# Patient Record
Sex: Female | Born: 1950 | Race: Black or African American | Hispanic: No | Marital: Married | Smoking: Current every day smoker
Health system: Southern US, Community
[De-identification: ages and names within clinical notes are randomized; demographics above are authoritative.]

## PROBLEM LIST (undated history)

## (undated) DIAGNOSIS — D219 Benign neoplasm of connective and other soft tissue, unspecified: Secondary | ICD-10-CM

## (undated) DIAGNOSIS — M797 Fibromyalgia: Secondary | ICD-10-CM

## (undated) DIAGNOSIS — G4730 Sleep apnea, unspecified: Secondary | ICD-10-CM

## (undated) DIAGNOSIS — F32A Depression, unspecified: Secondary | ICD-10-CM

## (undated) DIAGNOSIS — E119 Type 2 diabetes mellitus without complications: Secondary | ICD-10-CM

## (undated) DIAGNOSIS — D649 Anemia, unspecified: Secondary | ICD-10-CM

## (undated) DIAGNOSIS — H269 Unspecified cataract: Secondary | ICD-10-CM

## (undated) DIAGNOSIS — E739 Lactose intolerance, unspecified: Secondary | ICD-10-CM

## (undated) DIAGNOSIS — I10 Essential (primary) hypertension: Secondary | ICD-10-CM

## (undated) DIAGNOSIS — M1990 Unspecified osteoarthritis, unspecified site: Secondary | ICD-10-CM

## (undated) DIAGNOSIS — N920 Excessive and frequent menstruation with regular cycle: Secondary | ICD-10-CM

## (undated) DIAGNOSIS — F329 Major depressive disorder, single episode, unspecified: Secondary | ICD-10-CM

## (undated) DIAGNOSIS — K529 Noninfective gastroenteritis and colitis, unspecified: Secondary | ICD-10-CM

## (undated) DIAGNOSIS — E785 Hyperlipidemia, unspecified: Secondary | ICD-10-CM

## (undated) DIAGNOSIS — J439 Emphysema, unspecified: Secondary | ICD-10-CM

## (undated) HISTORY — DX: Major depressive disorder, single episode, unspecified: F32.9

## (undated) HISTORY — DX: Unspecified cataract: H26.9

## (undated) HISTORY — PX: OTHER SURGICAL HISTORY: SHX169

## (undated) HISTORY — DX: Lactose intolerance, unspecified: E73.9

## (undated) HISTORY — DX: Depression, unspecified: F32.A

## (undated) HISTORY — DX: Essential (primary) hypertension: I10

## (undated) HISTORY — PX: POLYPECTOMY: SHX149

## (undated) HISTORY — PX: LAPAROSCOPIC SALPINGO OOPHERECTOMY: SHX5927

## (undated) HISTORY — DX: Unspecified osteoarthritis, unspecified site: M19.90

## (undated) HISTORY — PX: CATARACT EXTRACTION, BILATERAL: SHX1313

## (undated) HISTORY — DX: Type 2 diabetes mellitus without complications: E11.9

## (undated) HISTORY — DX: Sleep apnea, unspecified: G47.30

## (undated) HISTORY — PX: ABDOMINAL HYSTERECTOMY: SHX81

## (undated) HISTORY — DX: Emphysema, unspecified: J43.9

## (undated) HISTORY — DX: Noninfective gastroenteritis and colitis, unspecified: K52.9

## (undated) HISTORY — DX: Fibromyalgia: M79.7

## (undated) HISTORY — DX: Anemia, unspecified: D64.9

## (undated) HISTORY — PX: TUBAL LIGATION: SHX77

## (undated) HISTORY — PX: BREAST BIOPSY: SHX20

## (undated) HISTORY — PX: ROTATOR CUFF REPAIR: SHX139

## (undated) HISTORY — DX: Benign neoplasm of connective and other soft tissue, unspecified: D21.9

## (undated) HISTORY — DX: Hyperlipidemia, unspecified: E78.5

## (undated) HISTORY — DX: Excessive and frequent menstruation with regular cycle: N92.0

---

## 1998-02-01 ENCOUNTER — Emergency Department (HOSPITAL_COMMUNITY): Admission: EM | Admit: 1998-02-01 | Discharge: 1998-02-01 | Payer: Self-pay | Admitting: Emergency Medicine

## 1998-11-11 ENCOUNTER — Other Ambulatory Visit: Admission: RE | Admit: 1998-11-11 | Discharge: 1998-11-11 | Payer: Self-pay | Admitting: Obstetrics and Gynecology

## 1998-11-16 ENCOUNTER — Ambulatory Visit (HOSPITAL_COMMUNITY): Admission: RE | Admit: 1998-11-16 | Discharge: 1998-11-16 | Payer: Self-pay | Admitting: Gastroenterology

## 2000-02-06 ENCOUNTER — Encounter: Payer: Self-pay | Admitting: Internal Medicine

## 2000-02-06 ENCOUNTER — Encounter: Admission: RE | Admit: 2000-02-06 | Discharge: 2000-02-06 | Payer: Self-pay | Admitting: Internal Medicine

## 2000-02-08 ENCOUNTER — Other Ambulatory Visit: Admission: RE | Admit: 2000-02-08 | Discharge: 2000-02-08 | Payer: Self-pay | Admitting: Obstetrics and Gynecology

## 2001-02-07 ENCOUNTER — Encounter: Payer: Self-pay | Admitting: Internal Medicine

## 2001-02-07 ENCOUNTER — Encounter: Admission: RE | Admit: 2001-02-07 | Discharge: 2001-02-07 | Payer: Self-pay | Admitting: Internal Medicine

## 2002-07-09 ENCOUNTER — Encounter: Payer: Self-pay | Admitting: Specialist

## 2002-07-09 ENCOUNTER — Ambulatory Visit (HOSPITAL_COMMUNITY): Admission: RE | Admit: 2002-07-09 | Discharge: 2002-07-09 | Payer: Self-pay | Admitting: Specialist

## 2002-07-31 ENCOUNTER — Encounter: Admission: RE | Admit: 2002-07-31 | Discharge: 2002-07-31 | Payer: Self-pay | Admitting: Obstetrics and Gynecology

## 2002-07-31 ENCOUNTER — Encounter: Payer: Self-pay | Admitting: Obstetrics and Gynecology

## 2002-08-05 ENCOUNTER — Emergency Department (HOSPITAL_COMMUNITY): Admission: EM | Admit: 2002-08-05 | Discharge: 2002-08-05 | Payer: Self-pay | Admitting: *Deleted

## 2002-09-11 ENCOUNTER — Other Ambulatory Visit: Admission: RE | Admit: 2002-09-11 | Discharge: 2002-09-11 | Payer: Self-pay | Admitting: Obstetrics and Gynecology

## 2003-06-30 ENCOUNTER — Ambulatory Visit (HOSPITAL_COMMUNITY): Admission: RE | Admit: 2003-06-30 | Discharge: 2003-06-30 | Payer: Self-pay | Admitting: Internal Medicine

## 2003-11-25 ENCOUNTER — Encounter: Admission: RE | Admit: 2003-11-25 | Discharge: 2003-11-25 | Payer: Self-pay | Admitting: Obstetrics and Gynecology

## 2003-11-26 ENCOUNTER — Encounter: Admission: RE | Admit: 2003-11-26 | Discharge: 2003-11-26 | Payer: Self-pay | Admitting: Obstetrics and Gynecology

## 2004-09-20 ENCOUNTER — Other Ambulatory Visit: Admission: RE | Admit: 2004-09-20 | Discharge: 2004-09-20 | Payer: Self-pay | Admitting: Obstetrics and Gynecology

## 2004-10-13 ENCOUNTER — Ambulatory Visit (HOSPITAL_COMMUNITY): Admission: RE | Admit: 2004-10-13 | Discharge: 2004-10-13 | Payer: Self-pay | Admitting: Gastroenterology

## 2004-10-24 ENCOUNTER — Ambulatory Visit (HOSPITAL_COMMUNITY): Admission: RE | Admit: 2004-10-24 | Discharge: 2004-10-24 | Payer: Self-pay | Admitting: Orthopedic Surgery

## 2004-10-24 ENCOUNTER — Ambulatory Visit (HOSPITAL_BASED_OUTPATIENT_CLINIC_OR_DEPARTMENT_OTHER): Admission: RE | Admit: 2004-10-24 | Discharge: 2004-10-24 | Payer: Self-pay | Admitting: Orthopedic Surgery

## 2006-06-23 ENCOUNTER — Emergency Department (HOSPITAL_COMMUNITY): Admission: EM | Admit: 2006-06-23 | Discharge: 2006-06-23 | Payer: Self-pay | Admitting: Emergency Medicine

## 2006-10-16 ENCOUNTER — Ambulatory Visit (HOSPITAL_COMMUNITY): Admission: RE | Admit: 2006-10-16 | Discharge: 2006-10-16 | Payer: Self-pay | Admitting: Internal Medicine

## 2006-11-13 ENCOUNTER — Ambulatory Visit (HOSPITAL_COMMUNITY): Admission: RE | Admit: 2006-11-13 | Discharge: 2006-11-13 | Payer: Self-pay | Admitting: Internal Medicine

## 2007-03-15 ENCOUNTER — Encounter: Admission: RE | Admit: 2007-03-15 | Discharge: 2007-03-15 | Payer: Self-pay | Admitting: Obstetrics and Gynecology

## 2007-09-04 ENCOUNTER — Ambulatory Visit (HOSPITAL_COMMUNITY): Admission: RE | Admit: 2007-09-04 | Discharge: 2007-09-04 | Payer: Self-pay | Admitting: Internal Medicine

## 2007-10-13 ENCOUNTER — Emergency Department (HOSPITAL_COMMUNITY): Admission: EM | Admit: 2007-10-13 | Discharge: 2007-10-13 | Payer: Self-pay | Admitting: Family Medicine

## 2008-03-17 ENCOUNTER — Encounter: Admission: RE | Admit: 2008-03-17 | Discharge: 2008-03-17 | Payer: Self-pay | Admitting: Obstetrics and Gynecology

## 2008-05-08 ENCOUNTER — Emergency Department (HOSPITAL_COMMUNITY): Admission: EM | Admit: 2008-05-08 | Discharge: 2008-05-08 | Payer: Self-pay | Admitting: Emergency Medicine

## 2009-04-21 ENCOUNTER — Encounter: Admission: RE | Admit: 2009-04-21 | Discharge: 2009-04-21 | Payer: Self-pay | Admitting: Obstetrics and Gynecology

## 2009-10-27 ENCOUNTER — Encounter: Admission: RE | Admit: 2009-10-27 | Discharge: 2009-10-27 | Payer: Self-pay | Admitting: Obstetrics and Gynecology

## 2009-11-10 ENCOUNTER — Encounter: Admission: RE | Admit: 2009-11-10 | Discharge: 2009-11-10 | Payer: Self-pay | Admitting: Obstetrics and Gynecology

## 2010-04-04 ENCOUNTER — Emergency Department (HOSPITAL_COMMUNITY): Admission: EM | Admit: 2010-04-04 | Discharge: 2010-04-04 | Payer: Self-pay | Admitting: Family Medicine

## 2010-06-07 ENCOUNTER — Emergency Department (HOSPITAL_COMMUNITY)
Admission: EM | Admit: 2010-06-07 | Discharge: 2010-06-07 | Payer: Self-pay | Source: Home / Self Care | Admitting: Family Medicine

## 2010-07-10 ENCOUNTER — Encounter: Payer: Self-pay | Admitting: Obstetrics and Gynecology

## 2010-11-04 NOTE — Op Note (Signed)
Sara Powell, Sara Powell             ACCOUNT NO.:  [id number]   MEDICAL RECORD NO.:  [id number]          PATIENT TYPE:  AMB   LOCATION:  DSC                          FACILITY:  [facility]   PHYSICIAN:  Mila Homer. Sherlean Foot, M.D. DATE OF BIRTH:  1950/10/28   DATE OF PROCEDURE:  10/24/2004  DATE OF DISCHARGE:                                 OPERATIVE REPORT   SURGEON:  Mila Homer. Sherlean Foot, M.D.   ASSISTANT:  Legrand Pitts. Duffy, P.A.   ANESTHESIA:  General.   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   PROCEDURE:  Right shoulder arthroscopy, subacromial decompression,  glenohumeral debridement, distal clavicle resection, and mini-open rotator  cuff repair.   INDICATIONS FOR PROCEDURE:  The patient is a 60 year old with MRI evidence  of full thickness rotator cuff tear.  Informed consent was obtained.   DESCRIPTION OF PROCEDURE:  The patient was laid supine and administered  general anesthesia.  The right upper extremity was prepped and draped in the  usual sterile fashion with the patient in the beach chair position.  After  sterile prep and drape, I made an anterior, posterior, and direct lateral  portal with a number 11 blade blunt trocar and cannula.  Diagnostic  arthroscopy of the glenohumeral joint revealed minimal chondromalacia but a  full thickness undersurface tear of the supraspinatus anterior as well as a  labral tear.  This was a degenerative labral tear and went from 3 to 9  o'clock.  I used the 3.5 Gator shaver and debrided the labrum back to stable  rim of tissue and debrided the undersurface of the rotator cuff.  I then  went from the posterior portal into the subacromial space and from the  direct lateral portal performed a bursectomy and anterolateral acromioplasty  with a  4.9 mm bur.  I then used the ArthroCare debridement wand to release  the CA ligament and debrided the undersurface of the clavicle to expose  that.  This was very prominent as was the anterolateral acromion.  I used a  4.9 mm bur to perform a distal clavicle resection.  This gave a nice  decompression to the rotator cuff and the rotator cuff tear was  approximately 2 cm long and not retracted.  I used the bur to bur a nice  trough and almost did this arthroscopically but decided to it mini open so I  evacuated it from the scope and then made our lateral incision another  centimeter long.  I then split the deltoid along the lines of its raphe and  put the Arthrex shoulder retractor in place.  I could easily see the tear,  evaluate it, and I put in two 5.5 mm BioCorkscew anchors.  I placed modified  Mason-Allen sutures, repairing the rotator cuff.  This gave an excellent  repair.  I then closed it with interrupted 0 and 2-0 Vicryl sutures and  Steri-Strips.   COMPLICATIONS:  None.   DRAINS:  None.  DRESSINGS:  Adaptic 4 x 4, 2 inch silk tape, simple sling.      SDL/MEDQ  D:  10/24/2004  T:  10/24/2004  Job:  [id number]

## 2010-11-04 NOTE — Op Note (Signed)
NAMERASHIA, MCKESSON             ACCOUNT NO.:  [id number]   MEDICAL RECORD NO.:  [id number]          PATIENT TYPE:  AMB   LOCATION:  ENDO                         FACILITY:  [facility]   PHYSICIAN:  John C. Madilyn Fireman, M.D.    DATE OF BIRTH:  03-14-1951   DATE OF PROCEDURE:  10/13/2004  DATE OF DISCHARGE:                                 OPERATIVE REPORT   PROCEDURE:  Colonoscopy.   INDICATIONS FOR PROCEDURE:  Family history of colon cancer in a first and  second-degree relative, last colonoscopy 5 years ago.   PROCEDURE:  The patient was placed in the left lateral decubitus position  and placed on pulse monitor with continuous low-flow oxygen delivered by  nasal cannula.  She was sedated with 62.5 mcg IV fentanyl and 7 mg IV  Versed.  The Olympus video colonoscope was inserted into the rectum and  advanced to the cecum, confirmed by transillumination of McBurney's point  and visualization at the ileocecal valve and appendiceal orifice.  The prep  was good.  The cecum, ascending, transverse, descending, and sigmoid colon  all appeared normal with no masses, polyps, diverticula, or any other  mucosal abnormalities.  The rectum likewise appeared normal, and retroflexed  view of the anus revealed no obvious internal hemorrhoids.  The scope was  then withdrawn and the patient returned to the recovery room in stable  condition.  She tolerated procedure well, and there were no immediate  complications.   IMPRESSION:  Normal colonoscopy.   PLAN:  Repeat study in 5 years.      JCH/MEDQ  D:  10/13/2004  T:  10/13/2004  Job:  [id number]   cc:   Margaretmary Bayley, M.D.  [address]  [place]  [place] [address]  Fax: [phone]

## 2010-12-27 ENCOUNTER — Inpatient Hospital Stay (INDEPENDENT_AMBULATORY_CARE_PROVIDER_SITE_OTHER)
Admission: RE | Admit: 2010-12-27 | Discharge: 2010-12-27 | Disposition: A | Discharge status: Home / Self Care | Payer: Self-pay | Source: Ambulatory Visit | Attending: Family Medicine | Admitting: Family Medicine

## 2010-12-27 DIAGNOSIS — I10 Essential (primary) hypertension: Secondary | ICD-10-CM

## 2011-01-31 ENCOUNTER — Inpatient Hospital Stay (INDEPENDENT_AMBULATORY_CARE_PROVIDER_SITE_OTHER)
Admission: RE | Admit: 2011-01-31 | Discharge: 2011-01-31 | Disposition: A | Discharge status: Home / Self Care | Payer: Self-pay | Source: Ambulatory Visit | Attending: Family Medicine | Admitting: Family Medicine

## 2011-01-31 DIAGNOSIS — L02619 Cutaneous abscess of unspecified foot: Secondary | ICD-10-CM

## 2011-01-31 DIAGNOSIS — L03039 Cellulitis of unspecified toe: Secondary | ICD-10-CM

## 2011-02-02 LAB — CULTURE, ROUTINE-ABSCESS

## 2011-02-14 ENCOUNTER — Inpatient Hospital Stay (INDEPENDENT_AMBULATORY_CARE_PROVIDER_SITE_OTHER)
Admission: RE | Admit: 2011-02-14 | Discharge: 2011-02-14 | Disposition: A | Discharge status: Home / Self Care | Payer: Self-pay | Source: Ambulatory Visit | Attending: Family Medicine | Admitting: Family Medicine

## 2011-02-14 DIAGNOSIS — L988 Other specified disorders of the skin and subcutaneous tissue: Secondary | ICD-10-CM

## 2011-03-21 LAB — POCT RAPID STREP A: Streptococcus, Group A Screen (Direct): NEGATIVE

## 2011-04-10 ENCOUNTER — Inpatient Hospital Stay (INDEPENDENT_AMBULATORY_CARE_PROVIDER_SITE_OTHER)
Admission: RE | Admit: 2011-04-10 | Discharge: 2011-04-10 | Disposition: A | Discharge status: Home / Self Care | Payer: Self-pay | Source: Ambulatory Visit | Attending: Emergency Medicine | Admitting: Emergency Medicine

## 2011-04-10 DIAGNOSIS — R51 Headache: Secondary | ICD-10-CM

## 2011-06-19 ENCOUNTER — Other Ambulatory Visit: Payer: Self-pay | Admitting: *Deleted

## 2012-02-27 ENCOUNTER — Other Ambulatory Visit (HOSPITAL_COMMUNITY): Payer: Self-pay | Admitting: Internal Medicine

## 2012-02-27 DIAGNOSIS — Z1231 Encounter for screening mammogram for malignant neoplasm of breast: Secondary | ICD-10-CM

## 2012-03-11 ENCOUNTER — Ambulatory Visit (HOSPITAL_COMMUNITY)
Admission: RE | Admit: 2012-03-11 | Discharge: 2012-03-11 | Disposition: A | Discharge status: Home / Self Care | Payer: Medicare Other | Source: Ambulatory Visit | Attending: Internal Medicine | Admitting: Internal Medicine

## 2012-03-11 DIAGNOSIS — Z1231 Encounter for screening mammogram for malignant neoplasm of breast: Secondary | ICD-10-CM | POA: Insufficient documentation

## 2012-08-15 ENCOUNTER — Ambulatory Visit: Payer: Medicare Other | Admitting: Obstetrics and Gynecology

## 2012-08-15 ENCOUNTER — Encounter: Payer: Self-pay | Admitting: Obstetrics and Gynecology

## 2012-08-15 VITALS — BP 154/76 | Ht 66.0 in | Wt 187.0 lb

## 2012-08-15 NOTE — Progress Notes (Signed)
Subjective:    Sara Powell is a 62 y.o. female [phone] who presents for annual exam. The patient complaints of fibromyalgia. She has had a hysterectomy and BSO.  She is married but is not sexually active.  The following portions of the patient's history were reviewed and updated as appropriate: allergies, current medications, past family history, past medical history, past social history, past surgical history and problem list.  Review of Systems Pertinent items are noted in HPI. Gastrointestinal:No change in bowel habits, no abdominal pain, no rectal bleeding. She does have IBS. Genitourinary:negative for dysuria, frequency, hematuria, nocturia and urinary incontinence    Objective:     BP 154/76  Ht 5\' 6"  (1.676 m)  Wt 187 lb (84.823 kg)  BMI 30.2 kg/m2  Weight:  Wt Readings from Last 1 Encounters:  08/15/12 187 lb (84.823 kg)     BMI: Body mass index is 30.2 kg/(m^2). General Appearance: Alert, appropriate appearance for age. No acute distress HEENT: Grossly normal Neck / Thyroid: Supple, no masses, nodes or enlargement Lungs: clear to auscultation bilaterally Back: No CVA tenderness Breast Exam: No masses or nodes.No dimpling, nipple retraction or discharge. Cardiovascular: Regular rate and rhythm. S1, S2, no murmur Gastrointestinal: Soft, non-tender, no masses or organomegaly  ++++++++++++++++++++++++++++++++++++++++++++++++++++++++  Pelvic Exam: External genitalia: normal general appearance Vaginal: normal without tenderness, induration or masses and atrophic mucosa Cervix: absent Adnexa: normal bimanual exam Uterus: absent Rectovaginal: normal rectal, no masses  ++++++++++++++++++++++++++++++++++++++++++++++++++++++++  Lymphatic Exam: Non-palpable nodes in neck, clavicular, axillary, or inguinal regions  Psychiatric: Alert and oriented, appropriate affect.      Assessment:    Normal gyn exam   Overweight or obese: Yes  Pelvic relaxation:  No  Menopausal symptoms: No. Severe: No.  Fibromyalgia  Irritable bowel syndrome   Plan:    Mammogram.   Follow-up:  for annual exam  The updated Pap smear screening guidelines were discussed with the patient. The patient requested that I obtain a Pap smear: No.  Kegel exercises discussed: Yes.  Proper diet and regular exercise were reviewed.  Annual mammograms recommended starting at age 49. Proper breast care was discussed.  Screening colonoscopy is recommended beginning at age 52.  Regular health maintenance was reviewed.  Sleep hygiene was discussed.  Adequate calcium and vitamin D intake was emphasized.  Leonard Schwartz M.D.   Regular Periods: Hysterectomy NO:[id number]}Mammogram: yes  Monthly Breast Ex.: yes Exercise: no  Tetanus < 10 years: yes Seatbelts: yes  NI. Bladder Functn.: yes Abuse at home: no  Daily BM's: yes Stressful Work: no  Healthy Diet: yes Sigmoid-Colonoscopy: per pt 2005  Calcium: no Medical problems this year: no   LAST PAP:03/11/2008  Contraception: Hysterectomy  Mammogram:  03/12/2012 Normal  PCP: Dr Shary Decamp  PMH: no  FMH: no  Last Bone Scan: none

## 2012-11-29 ENCOUNTER — Encounter: Payer: Self-pay | Admitting: Gastroenterology

## 2012-12-24 ENCOUNTER — Encounter: Payer: Self-pay | Admitting: Gastroenterology

## 2012-12-24 ENCOUNTER — Ambulatory Visit (AMBULATORY_SURGERY_CENTER): Payer: Medicare Other

## 2012-12-24 VITALS — Ht 66.0 in | Wt 185.2 lb

## 2012-12-24 DIAGNOSIS — Z800 Family history of malignant neoplasm of digestive organs: Secondary | ICD-10-CM

## 2012-12-24 MED ORDER — MOVIPREP 100 G PO SOLR: ORAL | Status: DC

## 2012-12-24 NOTE — Progress Notes (Signed)
Pt came into the office today for her pre-visit prior to her colonoscopy with Dr Jarold Motto on 12/30/12.Pt states she had a colonoscopy done with Dr Dorena Cookey years ago, but does not remember the date. She signed a medical release form to get all her GI records, which will be sent to [facility].

## 2012-12-30 ENCOUNTER — Ambulatory Visit (AMBULATORY_SURGERY_CENTER): Payer: Medicare Other | Admitting: Gastroenterology

## 2012-12-30 ENCOUNTER — Encounter: Payer: Self-pay | Admitting: Gastroenterology

## 2012-12-30 VITALS — BP 139/79 | HR 59 | Temp 98.1°F | Resp 14 | Ht 66.0 in | Wt 185.0 lb

## 2012-12-30 DIAGNOSIS — D126 Benign neoplasm of colon, unspecified: Secondary | ICD-10-CM

## 2012-12-30 DIAGNOSIS — Z1211 Encounter for screening for malignant neoplasm of colon: Secondary | ICD-10-CM

## 2012-12-30 DIAGNOSIS — Z800 Family history of malignant neoplasm of digestive organs: Secondary | ICD-10-CM

## 2012-12-30 MED ORDER — SODIUM CHLORIDE 0.9 % IV SOLN: 500.0000 mL | INTRAVENOUS | Status: DC

## 2012-12-30 NOTE — Progress Notes (Signed)
Called to room to assist during endoscopic procedure.  Patient ID and intended procedure confirmed with present staff. Received instructions for my participation in the procedure from the performing physician.  

## 2012-12-30 NOTE — Patient Instructions (Addendum)
Impressions/recommendations:  Polyps (handout given)  Repeat colonoscopy in 3 years.  YOU HAD AN ENDOSCOPIC PROCEDURE TODAY AT THE [facility]: Refer to the procedure report that was given to you for any specific questions about what was found during the examination.  If the procedure report does not answer your questions, please call your gastroenterologist to clarify.  If you requested that your care partner not be given the details of your procedure findings, then the procedure report has been included in a sealed envelope for you to review at your convenience later.  YOU SHOULD EXPECT: Some feelings of bloating in the abdomen. Passage of more gas than usual.  Walking can help get rid of the air that was put into your GI tract during the procedure and reduce the bloating. If you had a lower endoscopy (such as a colonoscopy or flexible sigmoidoscopy) you may notice spotting of blood in your stool or on the toilet paper. If you underwent a bowel prep for your procedure, then you may not have a normal bowel movement for a few days.  DIET: Your first meal following the procedure should be a light meal and then it is ok to progress to your normal diet.  A half-sandwich or bowl of soup is an example of a good first meal.  Heavy or fried foods are harder to digest and may make you feel nauseous or bloated.  Likewise meals heavy in dairy and vegetables can cause extra gas to form and this can also increase the bloating.  Drink plenty of fluids but you should avoid alcoholic beverages for 24 hours.  ACTIVITY: Your care partner should take you home directly after the procedure.  You should plan to take it easy, moving slowly for the rest of the day.  You can resume normal activity the day after the procedure however you should NOT DRIVE or use heavy machinery for 24 hours (because of the sedation medicines used during the test).    SYMPTOMS TO REPORT IMMEDIATELY: A gastroenterologist can be  reached at any hour.  During normal business hours, 8:30 AM to 5:00 PM Monday through Friday, call [phone].  After hours and on weekends, please call the GI answering service at [phone] who will take a message and have the physician on call contact you.   Following lower endoscopy (colonoscopy or flexible sigmoidoscopy):  Excessive amounts of blood in the stool  Significant tenderness or worsening of abdominal pains  Swelling of the abdomen that is new, acute  Fever of 100F or higher   FOLLOW UP: If any biopsies were taken you will be contacted by phone or by letter within the next 1-3 weeks.  Call your gastroenterologist if you have not heard about the biopsies in 3 weeks.  Our staff will call the home number listed on your records the next business day following your procedure to check on you and address any questions or concerns that you may have at that time regarding the information given to you following your procedure. This is a courtesy call and so if there is no answer at the home number and we have not heard from you through the emergency physician on call, we will assume that you have returned to your regular daily activities without incident.  SIGNATURES/CONFIDENTIALITY: You and/or your care partner have signed paperwork which will be entered into your electronic medical record.  These signatures attest to the fact that that the information above on your After Visit Summary has  been reviewed and is understood.  Full responsibility of the confidentiality of this discharge information lies with you and/or your care-partner.

## 2012-12-30 NOTE — Progress Notes (Signed)
Patient did not experience any of the following events: a burn prior to discharge; a fall within the facility; wrong site/side/patient/procedure/implant event; or a hospital transfer or hospital admission upon discharge from the facility. (G8907) Patient did not have preoperative order for IV antibiotic SSI prophylaxis. (G8918)  

## 2012-12-30 NOTE — Op Note (Signed)
[facility] [address] [place], [address]   COLONOSCOPY PROCEDURE REPORT  PATIENT: Sara, Powell  MR#: [id number] BIRTHDATE: 1951-06-05 , 62  yrs. old GENDER: Female ENDOSCOPIST: Mardella Layman, MD, [facility] REFERRED BY: PROCEDURE DATE:  12/30/2012 PROCEDURE:   Colonoscopy with snare polypectomy ASA CLASS:   Class II INDICATIONS:Patient's immediate family history of colon cancer. MEDICATIONS: propofol (Diprivan) 200mg  IV  DESCRIPTION OF PROCEDURE:   After the risks and benefits and of the procedure were explained, informed consent was obtained.  A digital rectal exam revealed no abnormalities of the rectum.    The LB [id number]  endoscope was introduced through the anus and advanced to the cecum, which was identified by both the appendix and ileocecal valve .  The quality of the prep was good, using MoviPrep .  The instrument was then slowly withdrawn as the colon was fully examined.     COLON FINDINGS: Three smooth flat polyps ranging between 3-38mm in size were found in the sigmoid colon.  A polypectomy was performed using snare cautery.  The resection was complete and the polyp tissue was partially retrieved.   The colon was otherwise normal. There was no diverticulosis, inflammation, polyps or cancers unless previously stated. Very redundant colon noted and difficult exam. Retroflexed views revealed no abnormalities.     The scope was then withdrawn from the patient and the procedure completed.  COMPLICATIONS: There were no complications. ENDOSCOPIC IMPRESSION: 1.   Three flat polyps ranging between 3-38mm in size were found in the sigmoid colon; polypectomy was performed using snare cautery 2.   The colon was otherwise normal ..FH colon cancer  RECOMMENDATIONS: 1.  Await pathology results 2.  Repeat Colonoscopy in 3 years.   REPEAT EXAM:  cc:  _______________________________ eSignedMardella Layman, MD, [facility] 12/30/2012 11:03  AM     PATIENT NAME:  Sara, Powell MR#: [id number]

## 2012-12-31 ENCOUNTER — Encounter: Payer: Self-pay | Admitting: Gastroenterology

## 2012-12-31 ENCOUNTER — Telehealth: Payer: Self-pay | Admitting: *Deleted

## 2012-12-31 NOTE — Telephone Encounter (Signed)
  Follow up Call-  Call back number 12/30/2012  Post procedure Call Back phone  # [phone]  Permission to leave phone message Yes     Patient questions:  Do you have a fever, pain , or abdominal swelling? no Pain Score  0 *  Have you tolerated food without any problems? yes  Have you been able to return to your normal activities? yes  Do you have any questions about your discharge instructions: Diet   no Medications  no Follow up visit  no  Do you have questions or concerns about your Care? no  Actions: * If pain score is 4 or above: No action needed, pain <4.

## 2013-01-07 ENCOUNTER — Encounter: Payer: Medicare Other | Admitting: Gastroenterology

## 2013-01-09 ENCOUNTER — Encounter: Payer: Self-pay | Admitting: Gastroenterology

## 2013-02-03 ENCOUNTER — Other Ambulatory Visit: Payer: Self-pay | Admitting: Obstetrics and Gynecology

## 2013-02-03 DIAGNOSIS — Z1231 Encounter for screening mammogram for malignant neoplasm of breast: Secondary | ICD-10-CM

## 2013-03-12 ENCOUNTER — Ambulatory Visit (HOSPITAL_COMMUNITY)
Admission: RE | Admit: 2013-03-12 | Discharge: 2013-03-12 | Disposition: A | Discharge status: Home / Self Care | Payer: Medicare Other | Source: Ambulatory Visit | Attending: Obstetrics and Gynecology | Admitting: Obstetrics and Gynecology

## 2013-03-12 DIAGNOSIS — Z1231 Encounter for screening mammogram for malignant neoplasm of breast: Secondary | ICD-10-CM

## 2013-12-22 ENCOUNTER — Other Ambulatory Visit: Payer: Self-pay | Admitting: Obstetrics and Gynecology

## 2013-12-22 DIAGNOSIS — N644 Mastodynia: Secondary | ICD-10-CM

## 2013-12-31 ENCOUNTER — Ambulatory Visit
Admission: RE | Admit: 2013-12-31 | Discharge: 2013-12-31 | Disposition: A | Discharge status: Home / Self Care | Payer: Medicare Other | Source: Ambulatory Visit | Attending: Obstetrics and Gynecology | Admitting: Obstetrics and Gynecology

## 2013-12-31 ENCOUNTER — Encounter (INDEPENDENT_AMBULATORY_CARE_PROVIDER_SITE_OTHER): Payer: Self-pay

## 2013-12-31 DIAGNOSIS — N644 Mastodynia: Secondary | ICD-10-CM

## 2014-02-13 ENCOUNTER — Other Ambulatory Visit (HOSPITAL_COMMUNITY): Payer: Self-pay | Admitting: Obstetrics and Gynecology

## 2014-03-12 ENCOUNTER — Other Ambulatory Visit: Payer: Self-pay

## 2014-03-12 DIAGNOSIS — Z1231 Encounter for screening mammogram for malignant neoplasm of breast: Secondary | ICD-10-CM

## 2014-04-01 ENCOUNTER — Ambulatory Visit
Admission: RE | Admit: 2014-04-01 | Discharge: 2014-04-01 | Disposition: A | Discharge status: Home / Self Care | Payer: Medicare Other | Source: Ambulatory Visit

## 2014-04-01 DIAGNOSIS — Z1231 Encounter for screening mammogram for malignant neoplasm of breast: Secondary | ICD-10-CM

## 2014-04-06 ENCOUNTER — Other Ambulatory Visit: Payer: Self-pay | Admitting: Obstetrics and Gynecology

## 2014-04-06 DIAGNOSIS — N6452 Nipple discharge: Secondary | ICD-10-CM

## 2014-04-16 ENCOUNTER — Ambulatory Visit
Admission: RE | Admit: 2014-04-16 | Discharge: 2014-04-16 | Disposition: A | Discharge status: Home / Self Care | Payer: Medicare Other | Source: Ambulatory Visit | Attending: Obstetrics and Gynecology | Admitting: Obstetrics and Gynecology

## 2014-04-16 DIAGNOSIS — N6452 Nipple discharge: Secondary | ICD-10-CM

## 2014-04-20 ENCOUNTER — Encounter: Payer: Self-pay | Admitting: Gastroenterology

## 2014-06-22 DIAGNOSIS — I129 Hypertensive chronic kidney disease with stage 1 through stage 4 chronic kidney disease, or unspecified chronic kidney disease: Secondary | ICD-10-CM | POA: Diagnosis not present

## 2014-06-22 DIAGNOSIS — Z0000 Encounter for general adult medical examination without abnormal findings: Secondary | ICD-10-CM | POA: Diagnosis not present

## 2014-06-22 DIAGNOSIS — E1121 Type 2 diabetes mellitus with diabetic nephropathy: Secondary | ICD-10-CM | POA: Diagnosis not present

## 2014-06-22 DIAGNOSIS — Z1389 Encounter for screening for other disorder: Secondary | ICD-10-CM | POA: Diagnosis not present

## 2014-06-22 DIAGNOSIS — D81819 Biotin-dependent carboxylase deficiency, unspecified: Secondary | ICD-10-CM | POA: Diagnosis not present

## 2014-06-22 DIAGNOSIS — Z23 Encounter for immunization: Secondary | ICD-10-CM | POA: Diagnosis not present

## 2014-06-29 DIAGNOSIS — I10 Essential (primary) hypertension: Secondary | ICD-10-CM | POA: Diagnosis not present

## 2014-06-29 DIAGNOSIS — E1121 Type 2 diabetes mellitus with diabetic nephropathy: Secondary | ICD-10-CM | POA: Diagnosis not present

## 2014-06-29 DIAGNOSIS — N182 Chronic kidney disease, stage 2 (mild): Secondary | ICD-10-CM | POA: Diagnosis not present

## 2014-06-29 DIAGNOSIS — F17210 Nicotine dependence, cigarettes, uncomplicated: Secondary | ICD-10-CM | POA: Diagnosis not present

## 2014-06-29 DIAGNOSIS — F419 Anxiety disorder, unspecified: Secondary | ICD-10-CM | POA: Diagnosis not present

## 2014-09-18 DIAGNOSIS — M797 Fibromyalgia: Secondary | ICD-10-CM | POA: Diagnosis not present

## 2014-09-18 DIAGNOSIS — N200 Calculus of kidney: Secondary | ICD-10-CM | POA: Diagnosis not present

## 2014-09-18 DIAGNOSIS — M1611 Unilateral primary osteoarthritis, right hip: Secondary | ICD-10-CM | POA: Diagnosis not present

## 2014-09-28 DIAGNOSIS — M545 Low back pain: Secondary | ICD-10-CM | POA: Diagnosis not present

## 2014-09-28 DIAGNOSIS — E1121 Type 2 diabetes mellitus with diabetic nephropathy: Secondary | ICD-10-CM | POA: Diagnosis not present

## 2014-09-28 DIAGNOSIS — R262 Difficulty in walking, not elsewhere classified: Secondary | ICD-10-CM | POA: Diagnosis not present

## 2014-09-28 DIAGNOSIS — I10 Essential (primary) hypertension: Secondary | ICD-10-CM | POA: Diagnosis not present

## 2014-09-28 DIAGNOSIS — M797 Fibromyalgia: Secondary | ICD-10-CM | POA: Diagnosis not present

## 2014-09-28 DIAGNOSIS — M25569 Pain in unspecified knee: Secondary | ICD-10-CM | POA: Diagnosis not present

## 2014-09-29 DIAGNOSIS — M25569 Pain in unspecified knee: Secondary | ICD-10-CM | POA: Diagnosis not present

## 2014-09-29 DIAGNOSIS — M545 Low back pain: Secondary | ICD-10-CM | POA: Diagnosis not present

## 2014-09-29 DIAGNOSIS — R262 Difficulty in walking, not elsewhere classified: Secondary | ICD-10-CM | POA: Diagnosis not present

## 2014-09-29 DIAGNOSIS — M797 Fibromyalgia: Secondary | ICD-10-CM | POA: Diagnosis not present

## 2014-09-30 DIAGNOSIS — M545 Low back pain: Secondary | ICD-10-CM | POA: Diagnosis not present

## 2014-09-30 DIAGNOSIS — M25569 Pain in unspecified knee: Secondary | ICD-10-CM | POA: Diagnosis not present

## 2014-09-30 DIAGNOSIS — R262 Difficulty in walking, not elsewhere classified: Secondary | ICD-10-CM | POA: Diagnosis not present

## 2014-09-30 DIAGNOSIS — M797 Fibromyalgia: Secondary | ICD-10-CM | POA: Diagnosis not present

## 2014-10-05 DIAGNOSIS — F17210 Nicotine dependence, cigarettes, uncomplicated: Secondary | ICD-10-CM | POA: Diagnosis not present

## 2014-10-05 DIAGNOSIS — F419 Anxiety disorder, unspecified: Secondary | ICD-10-CM | POA: Diagnosis not present

## 2014-10-05 DIAGNOSIS — N182 Chronic kidney disease, stage 2 (mild): Secondary | ICD-10-CM | POA: Diagnosis not present

## 2014-10-05 DIAGNOSIS — I129 Hypertensive chronic kidney disease with stage 1 through stage 4 chronic kidney disease, or unspecified chronic kidney disease: Secondary | ICD-10-CM | POA: Diagnosis not present

## 2014-10-05 DIAGNOSIS — E1122 Type 2 diabetes mellitus with diabetic chronic kidney disease: Secondary | ICD-10-CM | POA: Diagnosis not present

## 2014-10-07 DIAGNOSIS — M25569 Pain in unspecified knee: Secondary | ICD-10-CM | POA: Diagnosis not present

## 2014-10-07 DIAGNOSIS — M797 Fibromyalgia: Secondary | ICD-10-CM | POA: Diagnosis not present

## 2014-10-07 DIAGNOSIS — R262 Difficulty in walking, not elsewhere classified: Secondary | ICD-10-CM | POA: Diagnosis not present

## 2014-10-07 DIAGNOSIS — M545 Low back pain: Secondary | ICD-10-CM | POA: Diagnosis not present

## 2014-12-01 DIAGNOSIS — M5416 Radiculopathy, lumbar region: Secondary | ICD-10-CM | POA: Diagnosis not present

## 2014-12-01 DIAGNOSIS — M1611 Unilateral primary osteoarthritis, right hip: Secondary | ICD-10-CM | POA: Diagnosis not present

## 2014-12-01 DIAGNOSIS — M797 Fibromyalgia: Secondary | ICD-10-CM | POA: Diagnosis not present

## 2014-12-28 DIAGNOSIS — I129 Hypertensive chronic kidney disease with stage 1 through stage 4 chronic kidney disease, or unspecified chronic kidney disease: Secondary | ICD-10-CM | POA: Diagnosis not present

## 2014-12-28 DIAGNOSIS — E1122 Type 2 diabetes mellitus with diabetic chronic kidney disease: Secondary | ICD-10-CM | POA: Diagnosis not present

## 2014-12-28 DIAGNOSIS — E538 Deficiency of other specified B group vitamins: Secondary | ICD-10-CM | POA: Diagnosis not present

## 2014-12-31 DIAGNOSIS — M25551 Pain in right hip: Secondary | ICD-10-CM | POA: Diagnosis not present

## 2014-12-31 DIAGNOSIS — M1611 Unilateral primary osteoarthritis, right hip: Secondary | ICD-10-CM | POA: Diagnosis not present

## 2015-01-04 DIAGNOSIS — F3340 Major depressive disorder, recurrent, in remission, unspecified: Secondary | ICD-10-CM | POA: Diagnosis not present

## 2015-01-04 DIAGNOSIS — I129 Hypertensive chronic kidney disease with stage 1 through stage 4 chronic kidney disease, or unspecified chronic kidney disease: Secondary | ICD-10-CM | POA: Diagnosis not present

## 2015-01-04 DIAGNOSIS — F17210 Nicotine dependence, cigarettes, uncomplicated: Secondary | ICD-10-CM | POA: Diagnosis not present

## 2015-01-04 DIAGNOSIS — E1122 Type 2 diabetes mellitus with diabetic chronic kidney disease: Secondary | ICD-10-CM | POA: Diagnosis not present

## 2015-01-04 DIAGNOSIS — N182 Chronic kidney disease, stage 2 (mild): Secondary | ICD-10-CM | POA: Diagnosis not present

## 2015-01-04 DIAGNOSIS — Z23 Encounter for immunization: Secondary | ICD-10-CM | POA: Diagnosis not present

## 2015-02-02 DIAGNOSIS — M1611 Unilateral primary osteoarthritis, right hip: Secondary | ICD-10-CM | POA: Diagnosis not present

## 2015-02-19 DIAGNOSIS — M1611 Unilateral primary osteoarthritis, right hip: Secondary | ICD-10-CM | POA: Diagnosis not present

## 2015-03-04 DIAGNOSIS — M1611 Unilateral primary osteoarthritis, right hip: Secondary | ICD-10-CM | POA: Diagnosis not present

## 2015-03-10 DIAGNOSIS — M797 Fibromyalgia: Secondary | ICD-10-CM | POA: Diagnosis not present

## 2015-03-10 DIAGNOSIS — M5416 Radiculopathy, lumbar region: Secondary | ICD-10-CM | POA: Diagnosis not present

## 2015-03-10 DIAGNOSIS — Z6829 Body mass index (BMI) 29.0-29.9, adult: Secondary | ICD-10-CM | POA: Diagnosis not present

## 2015-03-10 DIAGNOSIS — M1611 Unilateral primary osteoarthritis, right hip: Secondary | ICD-10-CM | POA: Diagnosis not present

## 2015-03-10 DIAGNOSIS — Z01419 Encounter for gynecological examination (general) (routine) without abnormal findings: Secondary | ICD-10-CM | POA: Diagnosis not present

## 2015-03-10 DIAGNOSIS — M7552 Bursitis of left shoulder: Secondary | ICD-10-CM | POA: Diagnosis not present

## 2015-03-31 DIAGNOSIS — M545 Low back pain: Secondary | ICD-10-CM | POA: Diagnosis not present

## 2015-03-31 DIAGNOSIS — M47816 Spondylosis without myelopathy or radiculopathy, lumbar region: Secondary | ICD-10-CM | POA: Diagnosis not present

## 2015-04-15 DIAGNOSIS — R51 Headache: Secondary | ICD-10-CM | POA: Diagnosis not present

## 2015-04-15 DIAGNOSIS — M542 Cervicalgia: Secondary | ICD-10-CM | POA: Diagnosis not present

## 2015-04-15 DIAGNOSIS — G441 Vascular headache, not elsewhere classified: Secondary | ICD-10-CM | POA: Diagnosis not present

## 2015-04-15 DIAGNOSIS — M545 Low back pain: Secondary | ICD-10-CM | POA: Diagnosis not present

## 2015-04-15 DIAGNOSIS — M546 Pain in thoracic spine: Secondary | ICD-10-CM | POA: Diagnosis not present

## 2015-04-19 DIAGNOSIS — M545 Low back pain: Secondary | ICD-10-CM | POA: Diagnosis not present

## 2015-04-19 DIAGNOSIS — G441 Vascular headache, not elsewhere classified: Secondary | ICD-10-CM | POA: Diagnosis not present

## 2015-04-19 DIAGNOSIS — M542 Cervicalgia: Secondary | ICD-10-CM | POA: Diagnosis not present

## 2015-04-19 DIAGNOSIS — R51 Headache: Secondary | ICD-10-CM | POA: Diagnosis not present

## 2015-04-19 DIAGNOSIS — M546 Pain in thoracic spine: Secondary | ICD-10-CM | POA: Diagnosis not present

## 2015-04-28 DIAGNOSIS — G441 Vascular headache, not elsewhere classified: Secondary | ICD-10-CM | POA: Diagnosis not present

## 2015-04-28 DIAGNOSIS — M546 Pain in thoracic spine: Secondary | ICD-10-CM | POA: Diagnosis not present

## 2015-04-28 DIAGNOSIS — M542 Cervicalgia: Secondary | ICD-10-CM | POA: Diagnosis not present

## 2015-04-28 DIAGNOSIS — R51 Headache: Secondary | ICD-10-CM | POA: Diagnosis not present

## 2015-04-28 DIAGNOSIS — M545 Low back pain: Secondary | ICD-10-CM | POA: Diagnosis not present

## 2015-04-29 ENCOUNTER — Other Ambulatory Visit: Payer: Self-pay | Admitting: Internal Medicine

## 2015-04-29 ENCOUNTER — Other Ambulatory Visit: Payer: Self-pay | Admitting: Chiropractic Medicine

## 2015-04-29 DIAGNOSIS — M5416 Radiculopathy, lumbar region: Secondary | ICD-10-CM

## 2015-04-29 DIAGNOSIS — Z1231 Encounter for screening mammogram for malignant neoplasm of breast: Secondary | ICD-10-CM | POA: Diagnosis not present

## 2015-04-29 DIAGNOSIS — M5126 Other intervertebral disc displacement, lumbar region: Secondary | ICD-10-CM

## 2015-05-08 ENCOUNTER — Inpatient Hospital Stay
Admission: RE | Admit: 2015-05-08 | Discharge: 2015-05-08 | Disposition: A | Discharge status: Home / Self Care | Payer: Self-pay | Source: Ambulatory Visit | Attending: Chiropractic Medicine | Admitting: Chiropractic Medicine

## 2015-05-17 DIAGNOSIS — H04123 Dry eye syndrome of bilateral lacrimal glands: Secondary | ICD-10-CM | POA: Diagnosis not present

## 2015-05-17 DIAGNOSIS — H40033 Anatomical narrow angle, bilateral: Secondary | ICD-10-CM | POA: Diagnosis not present

## 2015-07-07 DIAGNOSIS — E538 Deficiency of other specified B group vitamins: Secondary | ICD-10-CM | POA: Diagnosis not present

## 2015-07-07 DIAGNOSIS — Z0000 Encounter for general adult medical examination without abnormal findings: Secondary | ICD-10-CM | POA: Diagnosis not present

## 2015-07-07 DIAGNOSIS — N390 Urinary tract infection, site not specified: Secondary | ICD-10-CM | POA: Diagnosis not present

## 2015-07-07 DIAGNOSIS — Z1389 Encounter for screening for other disorder: Secondary | ICD-10-CM | POA: Diagnosis not present

## 2015-07-07 DIAGNOSIS — E1122 Type 2 diabetes mellitus with diabetic chronic kidney disease: Secondary | ICD-10-CM | POA: Diagnosis not present

## 2015-07-07 DIAGNOSIS — I129 Hypertensive chronic kidney disease with stage 1 through stage 4 chronic kidney disease, or unspecified chronic kidney disease: Secondary | ICD-10-CM | POA: Diagnosis not present

## 2015-07-07 DIAGNOSIS — Z23 Encounter for immunization: Secondary | ICD-10-CM | POA: Diagnosis not present

## 2015-07-07 DIAGNOSIS — N182 Chronic kidney disease, stage 2 (mild): Secondary | ICD-10-CM | POA: Diagnosis not present

## 2015-07-07 DIAGNOSIS — Z13820 Encounter for screening for osteoporosis: Secondary | ICD-10-CM | POA: Diagnosis not present

## 2015-07-12 DIAGNOSIS — M5416 Radiculopathy, lumbar region: Secondary | ICD-10-CM | POA: Diagnosis not present

## 2015-07-12 DIAGNOSIS — M7552 Bursitis of left shoulder: Secondary | ICD-10-CM | POA: Diagnosis not present

## 2015-07-12 DIAGNOSIS — M797 Fibromyalgia: Secondary | ICD-10-CM | POA: Diagnosis not present

## 2015-07-12 DIAGNOSIS — M1611 Unilateral primary osteoarthritis, right hip: Secondary | ICD-10-CM | POA: Diagnosis not present

## 2015-07-27 ENCOUNTER — Ambulatory Visit (INDEPENDENT_AMBULATORY_CARE_PROVIDER_SITE_OTHER): Payer: Medicare Other | Admitting: Physician Assistant

## 2015-07-27 VITALS — BP 162/84 | HR 68 | Temp 98.9°F | Resp 18 | Ht 66.0 in | Wt 185.8 lb

## 2015-07-27 DIAGNOSIS — L989 Disorder of the skin and subcutaneous tissue, unspecified: Secondary | ICD-10-CM

## 2015-07-27 DIAGNOSIS — R030 Elevated blood-pressure reading, without diagnosis of hypertension: Secondary | ICD-10-CM | POA: Diagnosis not present

## 2015-07-27 DIAGNOSIS — S50821A Blister (nonthermal) of right forearm, initial encounter: Secondary | ICD-10-CM | POA: Diagnosis not present

## 2015-07-27 DIAGNOSIS — IMO0001 Reserved for inherently not codable concepts without codable children: Secondary | ICD-10-CM

## 2015-07-27 MED ORDER — MUPIROCIN 2 % EX OINT: 1.0000 "application " | TOPICAL_OINTMENT | Freq: Two times a day (BID) | CUTANEOUS | Status: DC

## 2015-07-27 MED ORDER — VALACYCLOVIR HCL 1 G PO TABS: 1000.0000 mg | ORAL_TABLET | Freq: Two times a day (BID) | ORAL | Status: DC

## 2015-07-27 NOTE — Patient Instructions (Signed)
Apply mupirocin ointment twice a day until scabbed over Take valtrex twice a day for 7 days.  Take your blood pressure regularly and keep a record to take to your primary in March.  Come back in 1 week for skin biopsy.

## 2015-07-27 NOTE — Progress Notes (Signed)
[facility] [address] [place] [address] [phone]  Date:  07/27/2015   Name:  Sara Powell   DOB:  Jan 21, 1951   MRN:  [id number]  PCP:  Thressa Sheller, MD    Chief Complaint: Arm Problem   History of Present Illness:  This is a 65 y.o. female with PMH HTN, depression who is presenting with a blister on her right forearm x 2 days. States started off with 2 small blisters which eventually coalesced into one big blister. There is some mild pain. No drainage. No other blisters/lesions. No burn or injury. No hx herpes or shingles.  Pt also complaining of a skin lesion on her right scalp. No itching or pain. Been there for 1 week. Red and raised.  Pt with elevated BP today to 162/84. She takes lisinopril 20 and hctz 25. She states her bp is not usually elevated. She took meds today. She has a bp monitor at home but doesn't check routinely. PCP: Thressa Sheller. Has appt for f/u in March.   Review of Systems:  Review of Systems See HPI  There are no active problems to display for this patient.   Prior to Admission medications   Medication Sig Start Date End Date Taking? Authorizing Provider  aspirin 81 MG tablet Take 81 mg by mouth daily.   Yes Historical Provider, MD  B Complex-Biotin-FA (B-COMPLEX PO) Take by mouth daily.   Yes Historical Provider, MD  buPROPion (WELLBUTRIN XL) 150 MG 24 hr tablet  12/06/12  Yes Historical Provider, MD  buPROPion (ZYBAN) 150 MG 12 hr tablet Take 150 mg by mouth 2 (two) times daily.   Yes Historical Provider, MD  hydrochlorothiazide (HYDRODIURIL) 25 MG tablet Take 25 mg by mouth daily.   Yes Historical Provider, MD  ibuprofen (ADVIL,MOTRIN) 600 MG tablet  12/10/12  Yes Historical Provider, MD  lisinopril (PRINIVIL,ZESTRIL) 20 MG tablet Take 20 mg by mouth daily.   Yes Historical Provider, MD  Multiple Vitamin (MULTIVITAMIN) tablet Take 1 tablet by mouth daily.   Yes Historical Provider, MD  tiZANidine (ZANAFLEX) 4 MG  capsule Take 4 mg by mouth 3 (three) times daily.   Yes Historical Provider, MD  traZODone (DESYREL) 50 MG tablet Take 50 mg by mouth at bedtime. Take one half   Yes Historical Provider, MD  traMADol (ULTRAM) 50 MG tablet Take 50 mg by mouth every 6 (six) hours as needed for pain. Reported on 07/27/2015    Historical Provider, MD    Allergies  Allergen Reactions  . Penicillins Hives and Itching  . Tylox [Oxycodone-Acetaminophen] Hives and Itching    Past Surgical History  Procedure Laterality Date  . Abdominal hysterectomy    . Laparoscopic salpingo oopherectomy    . Tubal ligation    . Rotator cuff repair      right shoulder  . Sty  on eyelid      left eye/removed 2 times  . Colonoscopy      Social History  Substance Use Topics  . Smoking status: Current Every Day Smoker -- 0.33 packs/day    Types: Cigarettes  . Smokeless tobacco: Never Used  . Alcohol Use: No     Comment: occasional beer    Family History  Problem Relation Age of Onset  . Diabetes Mother   . Hypertension Mother   . Hypertension Father   . Heart disease Maternal Aunt   . Heart attack Maternal Aunt   . Cancer Maternal Aunt  stomach  . Diabetes Paternal Aunt   . Diabetes Maternal Grandmother   . Stroke Maternal Grandmother   . Cancer Maternal Grandfather     colon  . Colon cancer Maternal Grandfather   . Diabetes Paternal Grandmother   . Colon cancer Sister   . Diabetes Brother   . Colon polyps Sister     Medication list has been reviewed and updated.  Physical Examination:  Physical Exam  Constitutional: She is oriented to person, place, and time. She appears well-developed and well-nourished. No distress.  HENT:  Head: Normocephalic and atraumatic.  Right Ear: Hearing normal.  Left Ear: Hearing normal.  Nose: Nose normal.  Eyes: Conjunctivae and lids are normal. Right eye exhibits no discharge. Left eye exhibits no discharge. No scleral icterus.  Cardiovascular: Normal rate, regular  rhythm, normal heart sounds and normal pulses.   No murmur heard. Pulmonary/Chest: Effort normal and breath sounds normal. No respiratory distress. She has no wheezes. She has no rhonchi. She has no rales.  Musculoskeletal: Normal range of motion.  Neurological: She is alert and oriented to person, place, and time.  Skin: Skin is warm, dry and intact.  1 cm blister on right volar forearm.  1 cm raised erythematous dry lesion on right side of scalp  Psychiatric: She has a normal mood and affect. Her speech is normal and behavior is normal. Thought content normal.   BP 162/84 mmHg  Pulse 68  Temp(Src) 98.9 F (37.2 C) (Oral)  Resp 18  Ht 5\' 6"  (1.676 m)  Wt 185 lb 12.8 oz (84.278 kg)  BMI 30.00 kg/m2  SpO2 95%  Assessment and Plan:  1. Blister of forearm, right, initial encounter Blister unroofed and obtained would culture and HSV culture. Treat with valtrex and mupirocin.  - mupirocin ointment (BACTROBAN) 2 %; Apply 1 application topically 2 (two) times daily.  Dispense: 22 g; Refill: 0 - valACYclovir (VALTREX) 1000 MG tablet; Take 1 tablet (1,000 mg total) by mouth 2 (two) times daily.  Dispense: 14 tablet; Refill: 0 - Wound culture - Herpes simplex virus culture  2. Skin lesion of scalp Pt will return in 3 weeks for biopsy if lesion is still there. May cancel if lesion resolves.  3. Elevated BP Pt states BP not usually elevated. She will continue medication and take BP at home and keep record to take to PCP - has appt in 1 month.   Benjaman Pott Drenda Freeze, MHS [facility]  08/03/2015

## 2015-07-29 LAB — HERPES SIMPLEX VIRUS CULTURE: Organism ID, Bacteria: NOT DETECTED

## 2015-07-30 LAB — WOUND CULTURE
GRAM STAIN: NONE SEEN
Gram Stain: NONE SEEN
Organism ID, Bacteria: NO GROWTH

## 2015-08-23 DIAGNOSIS — N182 Chronic kidney disease, stage 2 (mild): Secondary | ICD-10-CM | POA: Diagnosis not present

## 2015-08-23 DIAGNOSIS — F17210 Nicotine dependence, cigarettes, uncomplicated: Secondary | ICD-10-CM | POA: Diagnosis not present

## 2015-08-23 DIAGNOSIS — M8580 Other specified disorders of bone density and structure, unspecified site: Secondary | ICD-10-CM | POA: Diagnosis not present

## 2015-08-23 DIAGNOSIS — E1122 Type 2 diabetes mellitus with diabetic chronic kidney disease: Secondary | ICD-10-CM | POA: Diagnosis not present

## 2015-08-23 DIAGNOSIS — I129 Hypertensive chronic kidney disease with stage 1 through stage 4 chronic kidney disease, or unspecified chronic kidney disease: Secondary | ICD-10-CM | POA: Diagnosis not present

## 2015-08-24 ENCOUNTER — Ambulatory Visit: Payer: Medicare Other | Admitting: Physician Assistant

## 2015-08-30 DIAGNOSIS — L723 Sebaceous cyst: Secondary | ICD-10-CM | POA: Diagnosis not present

## 2015-08-30 DIAGNOSIS — L820 Inflamed seborrheic keratosis: Secondary | ICD-10-CM | POA: Diagnosis not present

## 2015-08-30 DIAGNOSIS — L818 Other specified disorders of pigmentation: Secondary | ICD-10-CM | POA: Diagnosis not present

## 2015-10-18 ENCOUNTER — Ambulatory Visit (INDEPENDENT_AMBULATORY_CARE_PROVIDER_SITE_OTHER): Payer: Medicare Other | Admitting: Emergency Medicine

## 2015-10-18 VITALS — BP 159/93 | HR 67 | Temp 98.3°F | Resp 16 | Ht 66.0 in | Wt 185.0 lb

## 2015-10-18 DIAGNOSIS — H00016 Hordeolum externum left eye, unspecified eyelid: Secondary | ICD-10-CM

## 2015-10-18 MED ORDER — ERYTHROMYCIN 5 MG/GM OP OINT: 1.0000 "application " | TOPICAL_OINTMENT | Freq: Four times a day (QID) | OPHTHALMIC | Status: DC

## 2015-10-18 NOTE — Progress Notes (Addendum)
Patient ID: Sara Powell, female   DOB: 1951-03-01, 65 y.o.   MRN: [id number]    By signing my name below, I, Essence Howell, attest that this documentation has been prepared under the direction and in the presence of Darlyne Russian, MD Electronically Signed: Ladene Artist, ED Scribe 10/18/2015 at 9:43 AM.  Chief Complaint:  Chief Complaint  Patient presents with  . Eye Problem    right, dry, crust, some swelling, since yesterday   HPI: Sara Powell is a 65 y.o. female who reports to [facility] today complaining of sudden onset of left eye crusting upon waking this morning. Pt initially noticed left eye pain that she describes as tenderness and swelling surrounding her left eye yesterday. No treatments tried PTA. She reports a h/o chronic dry eyes but states that she cannot afford Restasis. Pt reports h/o stye in the past. Allergy to Penicillins and Tylox.   Pt plans to go on a cruise to the [place] in 2 days.   Past Medical History  Diagnosis Date  . Fibroid     uterine  . Menorrhagia   . Anemia   . Fibromyalgia   . Lactose intolerance   . Colitis   . Diabetes mellitus without complication ([place])     Pre-diabeti/NO MEDS  . Arthritis    Past Surgical History  Procedure Laterality Date  . Abdominal hysterectomy    . Laparoscopic salpingo oopherectomy    . Tubal ligation    . Rotator cuff repair      right shoulder  . Sty  on eyelid      left eye/removed 2 times  . Colonoscopy     Social History   Social History  . Marital Status: Married    Spouse Name: N/A  . Number of Children: N/A  . Years of Education: N/A   Social History Main Topics  . Smoking status: Current Every Day Smoker -- 0.33 packs/day    Types: Cigarettes  . Smokeless tobacco: Never Used  . Alcohol Use: No     Comment: occasional beer  . Drug Use: No  . Sexual Activity: No   Other Topics Concern  . None   Social History Narrative   Family History  Problem Relation Age of Onset  . Diabetes  Mother   . Hypertension Mother   . Hypertension Father   . Heart disease Maternal Aunt   . Heart attack Maternal Aunt   . Cancer Maternal Aunt     stomach  . Diabetes Paternal Aunt   . Diabetes Maternal Grandmother   . Stroke Maternal Grandmother   . Cancer Maternal Grandfather     colon  . Colon cancer Maternal Grandfather   . Diabetes Paternal Grandmother   . Colon cancer Sister   . Diabetes Brother   . Colon polyps Sister    Allergies  Allergen Reactions  . Penicillins Hives and Itching  . Tylox [Oxycodone-Acetaminophen] Hives and Itching   Prior to Admission medications   Medication Sig Start Date End Date Taking? Authorizing Provider  aspirin 81 MG tablet Take 81 mg by mouth daily.   Yes Historical Provider, MD  B Complex-Biotin-FA (B-COMPLEX PO) Take by mouth daily.   Yes Historical Provider, MD  hydrochlorothiazide (HYDRODIURIL) 25 MG tablet Take 25 mg by mouth daily.   Yes Historical Provider, MD  ibuprofen (ADVIL,MOTRIN) 600 MG tablet  12/10/12  Yes Historical Provider, MD  lisinopril (PRINIVIL,ZESTRIL) 20 MG tablet Take 20 mg by mouth daily.  Yes Historical Provider, MD  Multiple Vitamin (MULTIVITAMIN) tablet Take 1 tablet by mouth daily.   Yes Historical Provider, MD  tiZANidine (ZANAFLEX) 4 MG capsule Take 4 mg by mouth 3 (three) times daily.   Yes Historical Provider, MD  traZODone (DESYREL) 50 MG tablet Take 50 mg by mouth at bedtime. Take one half   Yes Historical Provider, MD  buPROPion (WELLBUTRIN XL) 150 MG 24 hr tablet Reported on 10/18/2015 12/06/12   Historical Provider, MD  buPROPion (ZYBAN) 150 MG 12 hr tablet Take 150 mg by mouth 2 (two) times daily. Reported on 10/18/2015    Historical Provider, MD  mupirocin ointment (BACTROBAN) 2 % Apply 1 application topically 2 (two) times daily. Patient not taking: Reported on 10/18/2015 07/27/15   Ezekiel Slocumb, PA-C  traMADol (ULTRAM) 50 MG tablet Take 50 mg by mouth every 6 (six) hours as needed for pain. Reported on  10/18/2015    Historical Provider, MD  valACYclovir (VALTREX) 1000 MG tablet Take 1 tablet (1,000 mg total) by mouth 2 (two) times daily. Patient not taking: Reported on 10/18/2015 07/27/15   Ezekiel Slocumb, PA-C   ROS: The patient denies fevers, chills, night sweats, unintentional weight loss, chest pain, palpitations, wheezing, dyspnea on exertion, nausea, vomiting, abdominal pain, dysuria, hematuria, melena, numbness, weakness, or tingling.   All other systems have been reviewed and were otherwise negative with the exception of those mentioned in the HPI and as above.    PHYSICAL EXAM: Filed Vitals:   10/18/15 0855  BP: 168/84  Pulse: 67  Temp: 98.3 F (36.8 C)  Resp: 16   Body mass index is 29.87 kg/(m^2).  General: Alert, no acute distress HEENT:  Normocephalic, atraumatic, oropharynx patent. Eye: [facility], [facility]. Swelling of the L upper lid. Appears to be a stye presents of the mid lid. No injections. Cornea normal.   Cardiovascular: Regular rate and rhythm, no rubs murmurs or gallops. No Carotid bruits, radial pulse intact. No pedal edema.  Respiratory: Clear to auscultation bilaterally. No wheezes, rales, or rhonchi. No cyanosis, no use of accessory musculature Abdominal: No organomegaly, abdomen is soft and non-tender, positive bowel sounds. No masses. Musculoskeletal: Gait intact. No edema, tenderness Skin: No rashes. Neurologic: Facial musculature symmetric. Psychiatric: Patient acts appropriately throughout our interaction. Lymphatic: No cervical or submandibular lymphadenopathy  LABS:  EKG/XRAY:   Primary read interpreted by Dr. Everlene Farrier at [facility]  ASSESSMENT/PLAN: Blood pressure is somewhat elevated. She states she is stressed about her and her upcoming trip. Advised her to continue to take her medications regular and follow-up with her PCP. She has a stye left upper lid  will treat this with erythromycin ophthalmic ointment.I personally performed the services described in this  documentation, which was scribed in my presence. The recorded information has been reviewed and is accurate.   Gross sideeffects, risk and benefits, and alternatives of medications d/w patient. Patient is aware that all medications have potential sideeffects and we are unable to predict every sideeffect or drug-drug interaction that may occur.  Arlyss Queen MD 10/18/2015 9:31 AM

## 2015-10-18 NOTE — Patient Instructions (Addendum)
   IF you received an x-ray today, you will receive an invoice from [facility]. Please contact [facility] at [phone] with questions or concerns regarding your invoice.   IF you received labwork today, you will receive an invoice from [facility]. Please contact [facility] at [phone] with questions or concerns regarding your invoice.   Our billing staff will not be able to assist you with questions regarding bills from these companies.  You will be contacted with the lab results as soon as they are available. The fastest way to get your results is to activate your My Chart account. Instructions are located on the last page of this paperwork. If you have not heard from us regarding the results in 2 weeks, please contact this office.    Stye A stye is a bump on your eyelid caused by a bacterial infection. A stye can form inside the eyelid (internal stye) or outside the eyelid (external stye). An internal stye may be caused by an infected oil-producing gland inside your eyelid. An external stye may be caused by an infection at the base of your eyelash (hair follicle). Styes are very common. Anyone can get them at any age. They usually occur in just one eye, but you may have more than one in either eye.  CAUSES  The infection is almost always caused by bacteria called Staphylococcus aureus. This is a common type of bacteria that lives on your skin. RISK FACTORS You may be at higher risk for a stye if you have had one before. You may also be at higher risk if you have:  Diabetes.  Long-term illness.  Long-term eye redness.  A skin condition called seborrhea.  High fat levels in your blood (lipids). SIGNS AND SYMPTOMS  Eyelid pain is the most common symptom of a stye. Internal styes are more painful than external styes. Other signs and symptoms may include:  Painful swelling of your eyelid.  A scratchy feeling in your eye.  Tearing  and redness of your eye.  Pus draining from the stye. DIAGNOSIS  Your health care provider may be able to diagnose a stye just by examining your eye. The health care provider may also check to make sure:  You do not have a fever or other signs of a more serious infection.  The infection has not spread to other parts of your eye or areas around your eye. TREATMENT  Most styes will clear up in a few days without treatment. In some cases, you may need to use antibiotic drops or ointment to prevent infection. Your health care provider may have to drain the stye surgically if your stye is:  Large.  Causing a lot of pain.  Interfering with your vision. This can be done using a thin blade or a needle.  HOME CARE INSTRUCTIONS   Take medicines only as directed by your health care provider.  Apply a clean, warm compress to your eye for 10 minutes, 4 times a day.  Do not wear contact lenses or eye makeup until your stye has healed.  Do not try to pop or drain the stye. SEEK MEDICAL CARE IF:  You have chills or a fever.  Your stye does not go away after several days.  Your stye affects your vision.  Your eyeball becomes swollen, red, or painful. MAKE SURE YOU:  Understand these instructions.  Will watch your condition.  Will get help right away if you are not doing well or get   worse.   This information is not intended to replace advice given to you by your health care provider. Make sure you discuss any questions you have with your health care provider.   Document Released: 03/15/2005 Document Revised: 06/26/2014 Document Reviewed: 09/19/2013 Elsevier Interactive Patient Education 2016 Elsevier Inc.   

## 2015-10-26 ENCOUNTER — Ambulatory Visit (HOSPITAL_COMMUNITY)
Admission: EM | Admit: 2015-10-26 | Discharge: 2015-10-26 | Disposition: A | Discharge status: Home / Self Care | Payer: Medicare Other | Attending: Emergency Medicine | Admitting: Emergency Medicine

## 2015-10-26 ENCOUNTER — Encounter (HOSPITAL_COMMUNITY): Payer: Self-pay | Admitting: Emergency Medicine

## 2015-10-26 DIAGNOSIS — H00016 Hordeolum externum left eye, unspecified eyelid: Secondary | ICD-10-CM

## 2015-10-26 MED ORDER — CLINDAMYCIN HCL 300 MG PO CAPS: 300.0000 mg | ORAL_CAPSULE | Freq: Three times a day (TID) | ORAL | Status: DC

## 2015-10-26 NOTE — ED Provider Notes (Signed)
CSN: [id number]     Arrival date & time 10/26/15  1932 History   First MD Initiated Contact with Patient 10/26/15 2019     Chief Complaint  Patient presents with  . Stye   (Consider location/radiation/quality/duration/timing/severity/associated sxs/prior Treatment) HPI  She is a 65 year old woman here for evaluation of left upper eyelid lesion. This is been present for over a week. She was seen at [facility] urgent care 1 week ago and diagnosed with a stye. She was prescribed erythromycin ointment and warm compresses. She has been doing warm compresses and the ointment without improvement. She states the stye has gotten progressively larger. Today, it seemed like her whole eye became swollen. She has also noticed a small amount of bloody drainage from the stye.  Denies any pain of the eye itself or vision changes.  Past Medical History  Diagnosis Date  . Fibroid     uterine  . Menorrhagia   . Anemia   . Fibromyalgia   . Lactose intolerance   . Colitis   . Diabetes mellitus without complication ([place])     Pre-diabeti/NO MEDS  . Arthritis    Past Surgical History  Procedure Laterality Date  . Abdominal hysterectomy    . Laparoscopic salpingo oopherectomy    . Tubal ligation    . Rotator cuff repair      right shoulder  . Sty  on eyelid      left eye/removed 2 times  . Colonoscopy     Family History  Problem Relation Age of Onset  . Diabetes Mother   . Hypertension Mother   . Hypertension Father   . Heart disease Maternal Aunt   . Heart attack Maternal Aunt   . Cancer Maternal Aunt     stomach  . Diabetes Paternal Aunt   . Diabetes Maternal Grandmother   . Stroke Maternal Grandmother   . Cancer Maternal Grandfather     colon  . Colon cancer Maternal Grandfather   . Diabetes Paternal Grandmother   . Colon cancer Sister   . Diabetes Brother   . Colon polyps Sister    Social History  Substance Use Topics  . Smoking status: Current Every Day Smoker -- 0.33 packs/day   Types: Cigarettes  . Smokeless tobacco: Never Used  . Alcohol Use: No     Comment: occasional beer   OB History    Gravida Para Term Preterm AB TAB SAB Ectopic Multiple Living   5 3   2  2   3      Review of Systems As in history of present illness Allergies  Penicillins and Tylox  Home Medications   Prior to Admission medications   Medication Sig Start Date End Date Taking? Authorizing Provider  aspirin 81 MG tablet Take 81 mg by mouth daily.   Yes Historical Provider, MD  B Complex-Biotin-FA (B-COMPLEX PO) Take by mouth daily.   Yes Historical Provider, MD  buPROPion (WELLBUTRIN XL) 150 MG 24 hr tablet Reported on 10/18/2015 12/06/12  Yes Historical Provider, MD  buPROPion (ZYBAN) 150 MG 12 hr tablet Take 150 mg by mouth 2 (two) times daily. Reported on 10/18/2015   Yes Historical Provider, MD  erythromycin ophthalmic ointment Place 1 application into the left eye 4 (four) times daily. 10/18/15  Yes Darlyne Russian, MD  hydrochlorothiazide (HYDRODIURIL) 25 MG tablet Take 25 mg by mouth daily.   Yes Historical Provider, MD  ibuprofen (ADVIL,MOTRIN) 600 MG tablet  12/10/12  Yes Historical Provider, MD  lisinopril (  PRINIVIL,ZESTRIL) 20 MG tablet Take 20 mg by mouth daily.   Yes Historical Provider, MD  Multiple Vitamin (MULTIVITAMIN) tablet Take 1 tablet by mouth daily.   Yes Historical Provider, MD  mupirocin ointment (BACTROBAN) 2 % Apply 1 application topically 2 (two) times daily. 07/27/15  Yes Bennett Scrape V, PA-C  tiZANidine (ZANAFLEX) 4 MG capsule Take 4 mg by mouth 3 (three) times daily.   Yes Historical Provider, MD  traMADol (ULTRAM) 50 MG tablet Take 50 mg by mouth every 6 (six) hours as needed for pain. Reported on 10/18/2015   Yes Historical Provider, MD  traZODone (DESYREL) 50 MG tablet Take 50 mg by mouth at bedtime. Take one half   Yes Historical Provider, MD  valACYclovir (VALTREX) 1000 MG tablet Take 1 tablet (1,000 mg total) by mouth 2 (two) times daily. 07/27/15  Yes Bennett Scrape V,  PA-C  clindamycin (CLEOCIN) 300 MG capsule Take 1 capsule (300 mg total) by mouth 3 (three) times daily. 10/26/15   Melony Overly, MD   Meds Ordered and Administered this Visit  Medications - No data to display  BP 183/96 mmHg  Pulse 70  Temp(Src) 98.7 F (37.1 C)  Resp 16  SpO2 100% No data found.   Physical Exam  Constitutional: She is oriented to person, place, and time. She appears well-developed and well-nourished. No distress.  Eyes: Conjunctivae and EOM are normal.  Left upper eyelid with 1 cm fluctuant stye with overlying erythema. No surrounding erythema or edema. Palpation of the stye resulted in spontaneous drainage of purulent material.  Cardiovascular: Normal rate.   Pulmonary/Chest: Effort normal.  Neurological: She is alert and oriented to person, place, and time.    ED Course  Procedures (including critical care time)  Labs Review Labs Reviewed - No data to display  Imaging Review No results found.   MDM   1. Stye, left    Continue warm compresses. Clindamycin for 1 week. Follow-up as needed.    Melony Overly, MD 10/26/15 2049

## 2015-10-26 NOTE — Discharge Instructions (Signed)
You have a stye of your eyelid. It spontaneously drained, and we squeezed out the pus. Take clindamycin 3 times a day for 1 week. Continue to apply warm compresses for the next 3 days. Follow-up as needed.

## 2015-10-26 NOTE — ED Notes (Signed)
Patient c/o left eye swelling x 10 days. Patient reports that she was seen and treated with a Rx for erythromycin ointment and warm compress. She has been following her treatment plan with no relief. Patient is in NAD.

## 2015-11-12 DIAGNOSIS — H00014 Hordeolum externum left upper eyelid: Secondary | ICD-10-CM | POA: Diagnosis not present

## 2015-11-12 DIAGNOSIS — H40013 Open angle with borderline findings, low risk, bilateral: Secondary | ICD-10-CM | POA: Diagnosis not present

## 2015-11-18 ENCOUNTER — Encounter: Payer: Self-pay | Admitting: Gastroenterology

## 2015-11-27 ENCOUNTER — Ambulatory Visit (HOSPITAL_COMMUNITY)
Admission: EM | Admit: 2015-11-27 | Discharge: 2015-11-27 | Disposition: A | Discharge status: Home / Self Care | Payer: Medicare Other | Attending: Emergency Medicine | Admitting: Emergency Medicine

## 2015-11-27 ENCOUNTER — Encounter (HOSPITAL_COMMUNITY): Payer: Self-pay | Admitting: *Deleted

## 2015-11-27 DIAGNOSIS — S60423D Blister (nonthermal) of left middle finger, subsequent encounter: Secondary | ICD-10-CM | POA: Diagnosis not present

## 2015-11-27 DIAGNOSIS — N920 Excessive and frequent menstruation with regular cycle: Secondary | ICD-10-CM | POA: Diagnosis not present

## 2015-11-27 DIAGNOSIS — M797 Fibromyalgia: Secondary | ICD-10-CM | POA: Insufficient documentation

## 2015-11-27 DIAGNOSIS — E739 Lactose intolerance, unspecified: Secondary | ICD-10-CM | POA: Diagnosis not present

## 2015-11-27 DIAGNOSIS — M1990 Unspecified osteoarthritis, unspecified site: Secondary | ICD-10-CM | POA: Diagnosis not present

## 2015-11-27 DIAGNOSIS — F17210 Nicotine dependence, cigarettes, uncomplicated: Secondary | ICD-10-CM | POA: Diagnosis not present

## 2015-11-27 DIAGNOSIS — Z7982 Long term (current) use of aspirin: Secondary | ICD-10-CM | POA: Diagnosis not present

## 2015-11-27 DIAGNOSIS — X58XXXA Exposure to other specified factors, initial encounter: Secondary | ICD-10-CM | POA: Diagnosis not present

## 2015-11-27 DIAGNOSIS — Z880 Allergy status to penicillin: Secondary | ICD-10-CM | POA: Insufficient documentation

## 2015-11-27 DIAGNOSIS — E119 Type 2 diabetes mellitus without complications: Secondary | ICD-10-CM | POA: Diagnosis not present

## 2015-11-27 DIAGNOSIS — S60423A Blister (nonthermal) of left middle finger, initial encounter: Secondary | ICD-10-CM | POA: Diagnosis not present

## 2015-11-27 NOTE — Discharge Instructions (Signed)
Herpetic Whitlow Herpetic whitlow is an infection that affects the skin. It most commonly affects the fingers. The infection can recur, but the first occurrence is usually the most severe. CAUSES This condition is caused by herpes simplex virus (HSV) type 1 and HSV type 2. You can get herpetic whitlow if body fluids that are infected with either of these viruses get into a break in the skin. HSV commonly affects the mouth and genitals, but it can affect many other parts of the body. Most people who get herpetic whitlow have an HSV infection in another part of the body that spreads to the fingers through a cut. Health care workers can get herpetic whitlow from touching the mouths of patients who have oral herpes. RISK FACTORS This condition is more likely to develop in:  People who have an HSV infection.  People who work in the health care industry, particularly in dentistry. SYMPTOMS Symptoms of this condition usually develop 2-20 days after exposure to the virus. Early symptoms include:  Pain, burning, or tingling in the infected area.  Fever.  Redness.  Swelling. About 7-10 days after symptoms begin to appear, the lymph nodes may swell, and rice-sized fluid-filled bumps will develop on the infected area. These bumps will develop into sores or break open. Symptoms usually improve 10-14 days later when the sores crust over and heal. DIAGNOSIS This condition may be diagnosed with a physical exam. A sample of your skin or your blood may also be taken for testing. TREATMENT This condition goes away on its own. Your health care provider may prescribe topical or oral antiviral medicines to relieve symptoms and to prevent the infection from spreading to others. HOME CARE INSTRUCTIONS  Take or apply medicines only as directed by your health care provider.  Wash your hands often.  If you work in the health care industry, Landscape architect.  Apply ice to the affected area:  Put ice in a plastic  bag.  Place a towel between your skin and the bag.  Leave the ice on for 20 minutes, 2-3 times per day.  The infection can spread to other people. It can also spread to other areas of your body, especially to your mouth and your genital area. To keep it from spreading:  Cover the affected area with a bandage (dressing). If you work in the health care industry, Landscape architect.  Avoid close contact with other people until the bumps heal.  Do not share towels and washcloths.  Do not touch the bumps with your other hand or pick at your scabs.  Do not touch your eyes, mouth, or genital area unless you wash your hands first. SEEK MEDICAL CARE IF:  The infection has spread to another area of your body.  Your symptoms become severe.  The infection recurs.   This information is not intended to replace advice given to you by your health care provider. Make sure you discuss any questions you have with your health care provider.   Document Released: 08/26/2002 Document Revised: 10/20/2014 Document Reviewed: 03/17/2014 Elsevier Interactive Patient Education [employer].

## 2015-11-27 NOTE — ED Provider Notes (Signed)
CSN: [id number]     Arrival date & time 11/27/15  1313 History   First MD Initiated Contact with Patient 11/27/15 1420     Chief Complaint  Patient presents with  . Blister   (Consider location/radiation/quality/duration/timing/severity/associated sxs/prior Treatment) HPI History obtained from patient:  Pt presents with the cc of:  Blister left long finger Duration of symptoms: several days Treatment prior to arrival: using creams without resolution Context: previous symptom right arm, was tested for herpes which was negative, no lesion is on finger  Other symptoms include: spreading blister, started as 3 blisters now 1 big one Pain score: 2 FAMILY HISTORY: HTN mother    Past Medical History  Diagnosis Date  . Fibroid     uterine  . Menorrhagia   . Anemia   . Fibromyalgia   . Lactose intolerance   . Colitis   . Diabetes mellitus without complication ([place])     Pre-diabeti/NO MEDS  . Arthritis    Past Surgical History  Procedure Laterality Date  . Abdominal hysterectomy    . Laparoscopic salpingo oopherectomy    . Tubal ligation    . Rotator cuff repair      right shoulder  . Sty  on eyelid      left eye/removed 2 times  . Colonoscopy     Family History  Problem Relation Age of Onset  . Diabetes Mother   . Hypertension Mother   . Hypertension Father   . Heart disease Maternal Aunt   . Heart attack Maternal Aunt   . Cancer Maternal Aunt     stomach  . Diabetes Paternal Aunt   . Diabetes Maternal Grandmother   . Stroke Maternal Grandmother   . Cancer Maternal Grandfather     colon  . Colon cancer Maternal Grandfather   . Diabetes Paternal Grandmother   . Colon cancer Sister   . Diabetes Brother   . Colon polyps Sister    Social History  Substance Use Topics  . Smoking status: Current Every Day Smoker -- 0.50 packs/day    Types: Cigarettes  . Smokeless tobacco: Never Used  . Alcohol Use: No     Comment: occasional beer   OB History    Gravida Para  Term Preterm AB TAB SAB Ectopic Multiple Living   5 3   2  2   3      Review of Systems  Denies: HEADACHE, NAUSEA, ABDOMINAL PAIN, CHEST PAIN, CONGESTION, DYSURIA, SHORTNESS OF BREATH  Allergies  Penicillins and Tylox  Home Medications   Prior to Admission medications   Medication Sig Start Date End Date Taking? Authorizing Provider  aspirin 81 MG tablet Take 81 mg by mouth daily.   Yes Historical Provider, MD  B Complex-Biotin-FA (B-COMPLEX PO) Take by mouth daily.   Yes Historical Provider, MD  clindamycin (CLEOCIN) 300 MG capsule Take 1 capsule (300 mg total) by mouth 3 (three) times daily. 10/26/15  Yes Melony Overly, MD  hydrochlorothiazide (HYDRODIURIL) 25 MG tablet Take 25 mg by mouth daily.   Yes Historical Provider, MD  ibuprofen (ADVIL,MOTRIN) 600 MG tablet  12/10/12  Yes Historical Provider, MD  lisinopril (PRINIVIL,ZESTRIL) 20 MG tablet Take 20 mg by mouth daily.   Yes Historical Provider, MD  Multiple Vitamin (MULTIVITAMIN) tablet Take 1 tablet by mouth daily.   Yes Historical Provider, MD  mupirocin ointment (BACTROBAN) 2 % Apply 1 application topically 2 (two) times daily. 07/27/15  Yes Ezekiel Slocumb, PA-C  tiZANidine (ZANAFLEX) 4 MG  capsule Take 4 mg by mouth 3 (three) times daily.   Yes Historical Provider, MD  traZODone (DESYREL) 50 MG tablet Take 50 mg by mouth at bedtime. Take one half   Yes Historical Provider, MD  buPROPion (WELLBUTRIN XL) 150 MG 24 hr tablet Reported on 10/18/2015 12/06/12   Historical Provider, MD  buPROPion (ZYBAN) 150 MG 12 hr tablet Take 150 mg by mouth 2 (two) times daily. Reported on 10/18/2015    Historical Provider, MD  erythromycin ophthalmic ointment Place 1 application into the left eye 4 (four) times daily. 10/18/15   Darlyne Russian, MD  traMADol (ULTRAM) 50 MG tablet Take 50 mg by mouth every 6 (six) hours as needed for pain. Reported on 10/18/2015    Historical Provider, MD  valACYclovir (VALTREX) 1000 MG tablet Take 1 tablet (1,000 mg total) by mouth  2 (two) times daily. 07/27/15   Ezekiel Slocumb, PA-C   Meds Ordered and Administered this Visit  Medications - No data to display  BP 136/95 mmHg  Pulse 72  Temp(Src) 98.9 F (37.2 C) (Oral)  Resp 12  SpO2 97% No data found.   Physical Exam NURSES NOTES AND VITAL SIGNS REVIEWED. CONSTITUTIONAL: Well developed, well nourished, no acute distress HEENT: normocephalic, atraumatic EYES: Conjunctiva normal NECK:normal ROM, supple, no adenopathy PULMONARY:No respiratory distress, normal effort ABDOMINAL: Soft, ND, NT BS+, No CVAT MUSCULOSKELETAL: Normal ROM of all extremities, Left long finger, blistered non-tender lesion on the dorsal aspect of finger between PIP and MCP. SKIN: warm and dry without rash PSYCHIATRIC: Mood and affect, behavior are normal  ED Course  Procedures (including critical care time)  Labs Review Labs Reviewed  HSV CULTURE AND TYPING   pending Imaging Review No results found.   Visual Acuity Review  Right Eye Distance:   Left Eye Distance:   Bilateral Distance:    Right Eye Near:   Left Eye Near:    Bilateral Near:       Possible viral whitlow. Pt would like for cultures to return before treatment.  Hope for full recovery.  MDM   1. Blister (nonthermal) of left middle finger, subsequent encounter     Patient is reassured that there are no issues that require transfer to higher level of care at this time or additional tests. Patient is advised to continue home symptomatic treatment. Patient is advised that if there are new or worsening symptoms to attend the emergency department, contact primary care provider, or return to UC. Instructions of care provided discharged home in stable condition.    THIS NOTE WAS GENERATED USING A VOICE RECOGNITION SOFTWARE PROGRAM. ALL REASONABLE EFFORTS  WERE MADE TO PROOFREAD THIS DOCUMENT FOR ACCURACY.  I have verbally reviewed the discharge instructions with the patient. A printed AVS was given to the  patient.  All questions were answered prior to discharge.      Konrad Felix, [place] 11/27/15 1934

## 2015-11-27 NOTE — ED Notes (Signed)
Started 6/5 with 3 tiny blisters to left middle finger, that has since "grown into one large blister".  Denies pain, but c/o pruritis.  Has been cleansing with chlorhexidine 4% and applying Mupiricin oint without relief.  Reports having had same thing occur on right forearm in Feb - had viral cx done & was started on valacyclovir, but then herpes culture was negative.  Saw dermatologist in April, was told it was likely a bug bite.

## 2015-11-30 LAB — HSV CULTURE AND TYPING

## 2015-12-03 DIAGNOSIS — H401131 Primary open-angle glaucoma, bilateral, mild stage: Secondary | ICD-10-CM | POA: Diagnosis not present

## 2015-12-03 DIAGNOSIS — H00024 Hordeolum internum left upper eyelid: Secondary | ICD-10-CM | POA: Diagnosis not present

## 2015-12-04 ENCOUNTER — Encounter: Payer: Self-pay | Admitting: Physician Assistant

## 2016-01-05 ENCOUNTER — Ambulatory Visit (AMBULATORY_SURGERY_CENTER): Payer: Self-pay

## 2016-01-05 ENCOUNTER — Encounter: Payer: Self-pay | Admitting: Gastroenterology

## 2016-01-05 VITALS — Ht 66.0 in | Wt 185.0 lb

## 2016-01-05 DIAGNOSIS — Z86010 Personal history of colonic polyps: Secondary | ICD-10-CM

## 2016-01-05 MED ORDER — NA SULFATE-K SULFATE-MG SULF 17.5-3.13-1.6 GM/177ML PO SOLN: 1.0000 | Freq: Once | ORAL | Status: DC

## 2016-01-05 NOTE — Progress Notes (Signed)
No egg or soy allergy.  No previous complications from anesthesia. No home O2. No diet meds. 

## 2016-01-06 DIAGNOSIS — H401131 Primary open-angle glaucoma, bilateral, mild stage: Secondary | ICD-10-CM | POA: Diagnosis not present

## 2016-01-06 DIAGNOSIS — H40033 Anatomical narrow angle, bilateral: Secondary | ICD-10-CM | POA: Diagnosis not present

## 2016-01-14 ENCOUNTER — Other Ambulatory Visit: Payer: Self-pay | Admitting: Ophthalmology

## 2016-01-14 DIAGNOSIS — L928 Other granulomatous disorders of the skin and subcutaneous tissue: Secondary | ICD-10-CM | POA: Diagnosis not present

## 2016-01-14 DIAGNOSIS — H00024 Hordeolum internum left upper eyelid: Secondary | ICD-10-CM | POA: Diagnosis not present

## 2016-01-19 ENCOUNTER — Ambulatory Visit (AMBULATORY_SURGERY_CENTER): Payer: Medicare Other | Admitting: Gastroenterology

## 2016-01-19 ENCOUNTER — Encounter: Payer: Self-pay | Admitting: Gastroenterology

## 2016-01-19 VITALS — BP 124/88 | HR 64 | Temp 97.7°F | Resp 9 | Ht 66.0 in | Wt 185.0 lb

## 2016-01-19 DIAGNOSIS — Z800 Family history of malignant neoplasm of digestive organs: Secondary | ICD-10-CM | POA: Diagnosis not present

## 2016-01-19 DIAGNOSIS — Z86010 Personal history of colonic polyps: Secondary | ICD-10-CM | POA: Diagnosis present

## 2016-01-19 HISTORY — PX: COLONOSCOPY: SHX174

## 2016-01-19 MED ORDER — SODIUM CHLORIDE 0.9 % IV SOLN: 500.0000 mL | INTRAVENOUS | Status: DC

## 2016-01-19 NOTE — Patient Instructions (Signed)

## 2016-01-19 NOTE — Progress Notes (Signed)
To recovery, report to Scott,m RN, VSS

## 2016-01-19 NOTE — Op Note (Signed)
[place] Patient Name: Sara Powell Procedure Date: 01/19/2016 9:46 AM MRN: [id number] Endoscopist: Mauri Pole , MD Age: 66 Referring MD:  Date of Birth: 1951/06/09 Gender: Female Account #: [id number] Procedure:                Colonoscopy Indications:              Screening in patient at increased risk: Family                            history of 1st-degree relative with colorectal                            cancer, Colon cancer screening in patient at                            increased risk: Family history of colorectal cancer                            in multiple 2nd degree relatives, High risk colon                            cancer surveillance: Personal history of colonic                            polyps Medicines:                Monitored Anesthesia Care Procedure:                Pre-Anesthesia Assessment:                           - Prior to the procedure, a History and Physical                            was performed, and patient medications and                            allergies were reviewed. The patient's tolerance of                            previous anesthesia was also reviewed. The risks                            and benefits of the procedure and the sedation                            options and risks were discussed with the patient.                            All questions were answered, and informed consent                            was obtained. Prior Anticoagulants: The patient has  taken no previous anticoagulant or antiplatelet                            agents. ASA Grade Assessment: II - A patient with                            mild systemic disease. After reviewing the risks                            and benefits, the patient was deemed in                            satisfactory condition to undergo the procedure.                           After obtaining informed consent, the colonoscope                      was passed under direct vision. Throughout the                            procedure, the patient's blood pressure, pulse, and                            oxygen saturations were monitored continuously. The                            Model CF-HQ190L [id number]) scope was introduced                            through the anus and advanced to the the cecum,                            identified by appendiceal orifice and ileocecal                            valve. The colonoscopy was somewhat difficult due                            to inadequate bowel prep. Successful completion of                            the procedure was aided by lavage. The patient                            tolerated the procedure well. The quality of the                            bowel preparation was adequate after lavage and                            suction The ileocecal valve, appendiceal orifice,  and rectum were photographed. Scope In: 9:56:07 AM Scope Out: 10:14:54 AM Scope Withdrawal Time: 0 hours 12 minutes 1 second  Total Procedure Duration: 0 hours 18 minutes 47 seconds  Findings:                 Multiple small and large-mouthed diverticula were                            found in the sigmoid colon, descending colon and                            transverse colon.                           Non-bleeding internal hemorrhoids were found during                            retroflexion. The hemorrhoids were medium-sized. Complications:            No immediate complications. Estimated Blood Loss:     Estimated blood loss: none. Impression:               - Diverticulosis in the sigmoid colon, in the                            descending colon and in the transverse colon.                           - Non-bleeding internal hemorrhoids.                           - No specimens collected. Recommendation:           - Patient has a contact number available for                             emergencies. The signs and symptoms of potential                            delayed complications were discussed with the                            patient. Return to normal activities tomorrow.                            Written discharge instructions were provided to the                            patient.                           - Resume previous diet.                           - Continue present medications.                           - Repeat colonoscopy in 5 years  for surveillance.                           - Return to GI clinic PRN. Mauri Pole, MD 01/19/2016 10:20:14 AM This report has been signed electronically.

## 2016-01-20 ENCOUNTER — Telehealth: Payer: Self-pay

## 2016-01-20 NOTE — Telephone Encounter (Signed)
  Follow up Call-  Call back number 01/19/2016  Post procedure Call Back phone  # [phone]  Permission to leave phone message Yes  Some recent data might be hidden     Patient questions:  Do you have a fever, pain , or abdominal swelling? No. Pain Score  0 *  Have you tolerated food without any problems? Yes.    Have you been able to return to your normal activities? Yes.    Do you have any questions about your discharge instructions: Diet   No. Medications  No. Follow up visit  No.  Do you have questions or concerns about your Care? No.  Actions: * If pain score is 4 or above: No action needed, pain <4.

## 2016-02-23 DIAGNOSIS — E538 Deficiency of other specified B group vitamins: Secondary | ICD-10-CM | POA: Diagnosis not present

## 2016-02-23 DIAGNOSIS — I129 Hypertensive chronic kidney disease with stage 1 through stage 4 chronic kidney disease, or unspecified chronic kidney disease: Secondary | ICD-10-CM | POA: Diagnosis not present

## 2016-02-23 DIAGNOSIS — E1122 Type 2 diabetes mellitus with diabetic chronic kidney disease: Secondary | ICD-10-CM | POA: Diagnosis not present

## 2016-02-23 DIAGNOSIS — N182 Chronic kidney disease, stage 2 (mild): Secondary | ICD-10-CM | POA: Diagnosis not present

## 2016-02-23 DIAGNOSIS — M8580 Other specified disorders of bone density and structure, unspecified site: Secondary | ICD-10-CM | POA: Diagnosis not present

## 2016-02-23 DIAGNOSIS — Z1159 Encounter for screening for other viral diseases: Secondary | ICD-10-CM | POA: Diagnosis not present

## 2016-02-23 DIAGNOSIS — Z23 Encounter for immunization: Secondary | ICD-10-CM | POA: Diagnosis not present

## 2016-03-08 DIAGNOSIS — H401131 Primary open-angle glaucoma, bilateral, mild stage: Secondary | ICD-10-CM | POA: Diagnosis not present

## 2016-03-08 DIAGNOSIS — H40033 Anatomical narrow angle, bilateral: Secondary | ICD-10-CM | POA: Diagnosis not present

## 2016-03-10 DIAGNOSIS — M47812 Spondylosis without myelopathy or radiculopathy, cervical region: Secondary | ICD-10-CM | POA: Diagnosis not present

## 2016-03-10 DIAGNOSIS — M1611 Unilateral primary osteoarthritis, right hip: Secondary | ICD-10-CM | POA: Diagnosis not present

## 2016-03-10 DIAGNOSIS — M7552 Bursitis of left shoulder: Secondary | ICD-10-CM | POA: Diagnosis not present

## 2016-03-10 DIAGNOSIS — M5416 Radiculopathy, lumbar region: Secondary | ICD-10-CM | POA: Diagnosis not present

## 2016-03-10 DIAGNOSIS — M19012 Primary osteoarthritis, left shoulder: Secondary | ICD-10-CM | POA: Diagnosis not present

## 2016-03-10 DIAGNOSIS — M797 Fibromyalgia: Secondary | ICD-10-CM | POA: Diagnosis not present

## 2016-03-10 DIAGNOSIS — M542 Cervicalgia: Secondary | ICD-10-CM | POA: Diagnosis not present

## 2016-03-10 DIAGNOSIS — M25511 Pain in right shoulder: Secondary | ICD-10-CM | POA: Diagnosis not present

## 2016-03-10 DIAGNOSIS — M25512 Pain in left shoulder: Secondary | ICD-10-CM | POA: Diagnosis not present

## 2016-03-15 DIAGNOSIS — Z01411 Encounter for gynecological examination (general) (routine) with abnormal findings: Secondary | ICD-10-CM | POA: Diagnosis not present

## 2016-03-15 DIAGNOSIS — N63 Unspecified lump in breast: Secondary | ICD-10-CM | POA: Diagnosis not present

## 2016-03-15 DIAGNOSIS — N390 Urinary tract infection, site not specified: Secondary | ICD-10-CM | POA: Diagnosis not present

## 2016-03-17 ENCOUNTER — Other Ambulatory Visit: Payer: Self-pay | Admitting: Obstetrics and Gynecology

## 2016-03-17 DIAGNOSIS — N63 Unspecified lump in unspecified breast: Secondary | ICD-10-CM

## 2016-03-29 DIAGNOSIS — H40033 Anatomical narrow angle, bilateral: Secondary | ICD-10-CM | POA: Diagnosis not present

## 2016-03-30 ENCOUNTER — Ambulatory Visit
Admission: RE | Admit: 2016-03-30 | Discharge: 2016-03-30 | Disposition: A | Discharge status: Home / Self Care | Payer: Medicare Other | Source: Ambulatory Visit | Attending: Obstetrics and Gynecology | Admitting: Obstetrics and Gynecology

## 2016-03-30 ENCOUNTER — Other Ambulatory Visit: Payer: Self-pay | Admitting: Obstetrics and Gynecology

## 2016-03-30 DIAGNOSIS — N63 Unspecified lump in unspecified breast: Secondary | ICD-10-CM

## 2016-03-30 DIAGNOSIS — N6320 Unspecified lump in the left breast, unspecified quadrant: Secondary | ICD-10-CM | POA: Diagnosis not present

## 2016-03-30 DIAGNOSIS — R921 Mammographic calcification found on diagnostic imaging of breast: Secondary | ICD-10-CM

## 2016-04-05 ENCOUNTER — Ambulatory Visit
Admission: RE | Admit: 2016-04-05 | Discharge: 2016-04-05 | Disposition: A | Discharge status: Home / Self Care | Payer: Medicare Other | Source: Ambulatory Visit | Attending: Obstetrics and Gynecology | Admitting: Obstetrics and Gynecology

## 2016-04-05 DIAGNOSIS — R921 Mammographic calcification found on diagnostic imaging of breast: Secondary | ICD-10-CM

## 2016-04-05 DIAGNOSIS — R920 Mammographic microcalcification found on diagnostic imaging of breast: Secondary | ICD-10-CM | POA: Diagnosis not present

## 2016-04-05 DIAGNOSIS — D242 Benign neoplasm of left breast: Secondary | ICD-10-CM | POA: Diagnosis not present

## 2016-04-07 DIAGNOSIS — H40033 Anatomical narrow angle, bilateral: Secondary | ICD-10-CM | POA: Diagnosis not present

## 2016-07-20 DIAGNOSIS — H401131 Primary open-angle glaucoma, bilateral, mild stage: Secondary | ICD-10-CM | POA: Diagnosis not present

## 2016-07-20 DIAGNOSIS — H40033 Anatomical narrow angle, bilateral: Secondary | ICD-10-CM | POA: Diagnosis not present

## 2016-07-20 DIAGNOSIS — H2513 Age-related nuclear cataract, bilateral: Secondary | ICD-10-CM | POA: Diagnosis not present

## 2016-09-29 DIAGNOSIS — M169 Osteoarthritis of hip, unspecified: Secondary | ICD-10-CM | POA: Diagnosis not present

## 2016-09-29 DIAGNOSIS — Z79891 Long term (current) use of opiate analgesic: Secondary | ICD-10-CM | POA: Diagnosis not present

## 2016-09-29 DIAGNOSIS — M542 Cervicalgia: Secondary | ICD-10-CM | POA: Diagnosis not present

## 2016-09-29 DIAGNOSIS — Z79899 Other long term (current) drug therapy: Secondary | ICD-10-CM | POA: Diagnosis not present

## 2016-09-29 DIAGNOSIS — M47816 Spondylosis without myelopathy or radiculopathy, lumbar region: Secondary | ICD-10-CM | POA: Diagnosis not present

## 2016-09-29 DIAGNOSIS — G894 Chronic pain syndrome: Secondary | ICD-10-CM | POA: Diagnosis not present

## 2016-10-16 DIAGNOSIS — M47817 Spondylosis without myelopathy or radiculopathy, lumbosacral region: Secondary | ICD-10-CM | POA: Diagnosis not present

## 2016-10-24 DIAGNOSIS — L728 Other follicular cysts of the skin and subcutaneous tissue: Secondary | ICD-10-CM | POA: Diagnosis not present

## 2016-10-24 DIAGNOSIS — L821 Other seborrheic keratosis: Secondary | ICD-10-CM | POA: Diagnosis not present

## 2016-10-24 DIAGNOSIS — L603 Nail dystrophy: Secondary | ICD-10-CM | POA: Diagnosis not present

## 2016-10-24 DIAGNOSIS — L905 Scar conditions and fibrosis of skin: Secondary | ICD-10-CM | POA: Diagnosis not present

## 2016-10-26 DIAGNOSIS — F339 Major depressive disorder, recurrent, unspecified: Secondary | ICD-10-CM | POA: Diagnosis not present

## 2016-10-27 DIAGNOSIS — M25519 Pain in unspecified shoulder: Secondary | ICD-10-CM | POA: Diagnosis not present

## 2016-10-27 DIAGNOSIS — Z79891 Long term (current) use of opiate analgesic: Secondary | ICD-10-CM | POA: Diagnosis not present

## 2016-10-27 DIAGNOSIS — Z79899 Other long term (current) drug therapy: Secondary | ICD-10-CM | POA: Diagnosis not present

## 2016-10-27 DIAGNOSIS — M169 Osteoarthritis of hip, unspecified: Secondary | ICD-10-CM | POA: Diagnosis not present

## 2016-10-27 DIAGNOSIS — M542 Cervicalgia: Secondary | ICD-10-CM | POA: Diagnosis not present

## 2016-10-27 DIAGNOSIS — G894 Chronic pain syndrome: Secondary | ICD-10-CM | POA: Diagnosis not present

## 2016-11-09 DIAGNOSIS — M47817 Spondylosis without myelopathy or radiculopathy, lumbosacral region: Secondary | ICD-10-CM | POA: Diagnosis not present

## 2016-11-24 DIAGNOSIS — Z79891 Long term (current) use of opiate analgesic: Secondary | ICD-10-CM | POA: Diagnosis not present

## 2016-11-24 DIAGNOSIS — M47816 Spondylosis without myelopathy or radiculopathy, lumbar region: Secondary | ICD-10-CM | POA: Diagnosis not present

## 2016-11-24 DIAGNOSIS — M169 Osteoarthritis of hip, unspecified: Secondary | ICD-10-CM | POA: Diagnosis not present

## 2016-11-24 DIAGNOSIS — M542 Cervicalgia: Secondary | ICD-10-CM | POA: Diagnosis not present

## 2016-11-24 DIAGNOSIS — G894 Chronic pain syndrome: Secondary | ICD-10-CM | POA: Diagnosis not present

## 2016-11-24 DIAGNOSIS — Z79899 Other long term (current) drug therapy: Secondary | ICD-10-CM | POA: Diagnosis not present

## 2016-11-27 DIAGNOSIS — M47817 Spondylosis without myelopathy or radiculopathy, lumbosacral region: Secondary | ICD-10-CM | POA: Diagnosis not present

## 2016-11-28 DIAGNOSIS — E538 Deficiency of other specified B group vitamins: Secondary | ICD-10-CM | POA: Diagnosis not present

## 2016-11-28 DIAGNOSIS — I129 Hypertensive chronic kidney disease with stage 1 through stage 4 chronic kidney disease, or unspecified chronic kidney disease: Secondary | ICD-10-CM | POA: Diagnosis not present

## 2016-11-28 DIAGNOSIS — I10 Essential (primary) hypertension: Secondary | ICD-10-CM | POA: Diagnosis not present

## 2016-11-28 DIAGNOSIS — E1122 Type 2 diabetes mellitus with diabetic chronic kidney disease: Secondary | ICD-10-CM | POA: Diagnosis not present

## 2016-11-28 DIAGNOSIS — E559 Vitamin D deficiency, unspecified: Secondary | ICD-10-CM | POA: Diagnosis not present

## 2016-11-28 DIAGNOSIS — Z0000 Encounter for general adult medical examination without abnormal findings: Secondary | ICD-10-CM | POA: Diagnosis not present

## 2016-12-01 DIAGNOSIS — M25511 Pain in right shoulder: Secondary | ICD-10-CM | POA: Diagnosis not present

## 2016-12-01 DIAGNOSIS — M545 Low back pain: Secondary | ICD-10-CM | POA: Diagnosis not present

## 2016-12-01 DIAGNOSIS — M25512 Pain in left shoulder: Secondary | ICD-10-CM | POA: Diagnosis not present

## 2016-12-05 DIAGNOSIS — I129 Hypertensive chronic kidney disease with stage 1 through stage 4 chronic kidney disease, or unspecified chronic kidney disease: Secondary | ICD-10-CM | POA: Diagnosis not present

## 2016-12-05 DIAGNOSIS — E1122 Type 2 diabetes mellitus with diabetic chronic kidney disease: Secondary | ICD-10-CM | POA: Diagnosis not present

## 2016-12-05 DIAGNOSIS — E538 Deficiency of other specified B group vitamins: Secondary | ICD-10-CM | POA: Diagnosis not present

## 2016-12-05 DIAGNOSIS — N182 Chronic kidney disease, stage 2 (mild): Secondary | ICD-10-CM | POA: Diagnosis not present

## 2016-12-08 DIAGNOSIS — M25512 Pain in left shoulder: Secondary | ICD-10-CM | POA: Diagnosis not present

## 2016-12-08 DIAGNOSIS — M47816 Spondylosis without myelopathy or radiculopathy, lumbar region: Secondary | ICD-10-CM | POA: Diagnosis not present

## 2016-12-08 DIAGNOSIS — G894 Chronic pain syndrome: Secondary | ICD-10-CM | POA: Diagnosis not present

## 2016-12-08 DIAGNOSIS — Z79899 Other long term (current) drug therapy: Secondary | ICD-10-CM | POA: Diagnosis not present

## 2016-12-08 DIAGNOSIS — M169 Osteoarthritis of hip, unspecified: Secondary | ICD-10-CM | POA: Diagnosis not present

## 2016-12-08 DIAGNOSIS — Z79891 Long term (current) use of opiate analgesic: Secondary | ICD-10-CM | POA: Diagnosis not present

## 2016-12-08 DIAGNOSIS — M542 Cervicalgia: Secondary | ICD-10-CM | POA: Diagnosis not present

## 2016-12-08 DIAGNOSIS — M545 Low back pain: Secondary | ICD-10-CM | POA: Diagnosis not present

## 2016-12-08 DIAGNOSIS — M25511 Pain in right shoulder: Secondary | ICD-10-CM | POA: Diagnosis not present

## 2016-12-11 ENCOUNTER — Encounter (HOSPITAL_COMMUNITY): Payer: Self-pay | Admitting: *Deleted

## 2016-12-11 ENCOUNTER — Emergency Department (HOSPITAL_COMMUNITY)
Admission: EM | Admit: 2016-12-11 | Discharge: 2016-12-11 | Disposition: A | Discharge status: Home / Self Care | Payer: Medicare Other | Attending: Emergency Medicine | Admitting: Emergency Medicine

## 2016-12-11 DIAGNOSIS — I10 Essential (primary) hypertension: Secondary | ICD-10-CM | POA: Insufficient documentation

## 2016-12-11 DIAGNOSIS — K047 Periapical abscess without sinus: Secondary | ICD-10-CM | POA: Diagnosis not present

## 2016-12-11 DIAGNOSIS — Z7982 Long term (current) use of aspirin: Secondary | ICD-10-CM | POA: Diagnosis not present

## 2016-12-11 DIAGNOSIS — E119 Type 2 diabetes mellitus without complications: Secondary | ICD-10-CM | POA: Diagnosis not present

## 2016-12-11 DIAGNOSIS — Z79899 Other long term (current) drug therapy: Secondary | ICD-10-CM | POA: Diagnosis not present

## 2016-12-11 DIAGNOSIS — F17210 Nicotine dependence, cigarettes, uncomplicated: Secondary | ICD-10-CM | POA: Diagnosis not present

## 2016-12-11 DIAGNOSIS — R220 Localized swelling, mass and lump, head: Secondary | ICD-10-CM | POA: Diagnosis present

## 2016-12-11 MED ORDER — CLINDAMYCIN HCL 150 MG PO CAPS: 150.0000 mg | ORAL_CAPSULE | Freq: Four times a day (QID) | ORAL | 0 refills | Status: DC

## 2016-12-11 MED ORDER — IBUPROFEN 800 MG PO TABS
800.0000 mg | ORAL_TABLET | Freq: Once | ORAL | Status: AC
  Administered 2016-12-11: 800 mg via ORAL
  Filled 2016-12-11: qty 1

## 2016-12-11 MED ORDER — BENZOCAINE 20 % MT AERO
INHALATION_SPRAY | Freq: Once | OROMUCOSAL | Status: AC
  Administered 2016-12-11: 12:00:00 via OROMUCOSAL
  Filled 2016-12-11: qty 57

## 2016-12-11 MED ORDER — HYDROCODONE-ACETAMINOPHEN 5-325 MG PO TABS: 1.0000 | ORAL_TABLET | ORAL | 0 refills | Status: DC | PRN

## 2016-12-11 MED ORDER — BUPIVACAINE HCL (PF) 0.5 % IJ SOLN
10.0000 mL | Freq: Once | INTRAMUSCULAR | Status: AC
  Administered 2016-12-11: 10 mL
  Filled 2016-12-11: qty 10

## 2016-12-11 MED ORDER — CLINDAMYCIN HCL 150 MG PO CAPS
300.0000 mg | ORAL_CAPSULE | Freq: Once | ORAL | Status: AC
  Administered 2016-12-11: 300 mg via ORAL
  Filled 2016-12-11: qty 2

## 2016-12-11 NOTE — ED Provider Notes (Signed)
[facility] DEPT Provider Note   CSN: [id number] Arrival date & time: 12/11/16  4580     History   Chief Complaint Chief Complaint  Patient presents with  . Facial Swelling    HPI Sara Powell is a 66 y.o. female.  HPI Patient reports she has a bad tooth in her lower left jaw. She reports it was not giving her pain or any problems. Saturday however she started to notice some unusual sensations around the lower jaw and then subsequently began getting swelling of the face. She reports yesterday she was trying saltwater mouthwash and a topical antiseptic that she had gotten at the pharmacy. This morning however the jaw has become much more swollen and now the area is very painful. Past Medical History:  Diagnosis Date  . Anemia   . Arthritis   . Colitis   . Depression   . Diabetes mellitus without complication ([place])    Pre-diabeti/NO MEDS  . Fibroid    uterine  . Fibromyalgia   . Hypertension   . Lactose intolerance   . Menorrhagia     There are no active problems to display for this patient.   Past Surgical History:  Procedure Laterality Date  . ABDOMINAL HYSTERECTOMY    . COLONOSCOPY    . LAPAROSCOPIC SALPINGO OOPHERECTOMY    . POLYPECTOMY    . ROTATOR CUFF REPAIR     right shoulder  . sty  on eyelid     left eye/removed 2 times  . TUBAL LIGATION      OB History    Gravida Para Term Preterm AB Living   _0 SAB TAB Ectopic Multiple Live Births   2       3       Home Medications    Prior to Admission medications   Medication Sig Start Date End Date Taking? Authorizing Provider  aspirin 81 MG tablet Take 81 mg by mouth daily.    [provider]  B Complex-Biotin-FA (B-COMPLEX PO) Take by mouth daily.    [provider]  buPROPion (ZYBAN) 150 MG 12 hr tablet Take 150 mg by mouth 2 (two) times daily. Reported on 01/05/2016    [provider]  clindamycin (CLEOCIN) 150 MG capsule Take 1 capsule (150 mg total) by  mouth every 6 (six) hours. 12/11/16   Charlesetta Shanks, MD  erythromycin ophthalmic ointment Place 1 application into the left eye 4 (four) times daily. 10/18/15   Darlyne Russian, MD  hydrochlorothiazide (HYDRODIURIL) 25 MG tablet Take 25 mg by mouth daily.    [provider]  HYDROcodone-acetaminophen (NORCO/VICODIN) 5-325 MG tablet Take 1-2 tablets by mouth every 4 (four) hours as needed for moderate pain or severe pain. 12/11/16   Charlesetta Shanks, MD  ibuprofen (ADVIL,MOTRIN) 600 MG tablet  12/10/12   [provider]  latanoprost (XALATAN) 0.005 % ophthalmic solution 1 drop at bedtime.    [provider]  lisinopril (PRINIVIL,ZESTRIL) 20 MG tablet Take 20 mg by mouth daily.    [provider]  Multiple Vitamin (MULTIVITAMIN) tablet Take 1 tablet by mouth daily.    [provider]  mupirocin ointment (BACTROBAN) 2 % Apply 1 application topically 2 (two) times daily. Patient not taking: Reported on 01/19/2016 07/27/15   Ezekiel Slocumb, PA-C  Na Sulfate-K Sulfate-Mg Sulf (SUPREP BOWEL PREP KIT) 17.5-3.13-1.6 GM/180ML SOLN Take 1 kit by mouth once. Name brand only, no substitutions, take as  directed 01/05/16   Mauri Pole, MD  naproxen sodium (ANAPROX) 220 MG tablet Take 220 mg by mouth 2 (two) times daily with a meal.    [provider]  tiZANidine (ZANAFLEX) 4 MG capsule Take 4 mg by mouth 3 (three) times daily.    [provider]  traZODone (DESYREL) 50 MG tablet Take 50 mg by mouth at bedtime. Take one half    [provider]  valACYclovir (VALTREX) 1000 MG tablet Take 1 tablet (1,000 mg total) by mouth 2 (two) times daily. Patient not taking: Reported on 01/05/2016 07/27/15   Tacy Dura    Family History Family History  Problem Relation Age of Onset  . Diabetes Mother   . Hypertension Mother   . Hypertension Father   . Colon cancer Sister   . Diabetes Brother   . Colon polyps Sister   . Heart disease Maternal  Aunt   . Heart attack Maternal Aunt   . Cancer Maternal Aunt        stomach  . Diabetes Paternal Aunt   . Diabetes Maternal Grandmother   . Stroke Maternal Grandmother   . Cancer Maternal Grandfather        colon  . Colon cancer Maternal Grandfather   . Diabetes Paternal Grandmother     Social History Social History  Substance Use Topics  . Smoking status: Current Every Day Smoker    Packs/day: 0.50    Types: Cigarettes  . Smokeless tobacco: Never Used  . Alcohol use 3.0 oz/week    5 Standard drinks or equivalent per week     Comment: wine     Allergies   Penicillins and Tylox [oxycodone-acetaminophen]   Review of Systems Review of Systems 10 Systems reviewed and are negative for acute change except as noted in the HPI.   Physical Exam Updated Vital Signs BP (!) 143/84   Pulse 69   Temp 98.9 F (37.2 C) (Oral)   Resp 16   Ht _0  (1.676 m)   Wt 83.5 kg (184 lb)   SpO2 96%   BMI 29.70 kg/m   Physical Exam  Constitutional: She is oriented to person, place, and time. She appears well-developed and well-nourished.  Patient is alert appropriate and nontoxic. She does appear uncomfortable with evident focal facial swelling.  HENT:  Moderate swelling of the mandible on the left localized to the mid/anterior portion. Areas very tender. No overlying erythema or skin changes. Patient does not have trismus. The inferior posterior  First molar is decayed and the second and third molars are missing although she thinks some skin is grown up over the second molar. There is an area of fullness in the buccal mucosa adjacent to the first inferior molar.  Eyes: EOM are normal. Pupils are equal, round, and reactive to light.  Neck: Neck supple.  Cardiovascular: Normal rate, regular rhythm, normal heart sounds and intact distal pulses.   Pulmonary/Chest: Effort normal and breath sounds normal.  Musculoskeletal: Normal range of motion.  Neurological: She is alert and oriented to  person, place, and time. No cranial nerve deficit. She exhibits normal muscle tone. Coordination normal.  Skin: Skin is warm and dry.  Psychiatric: She has a normal mood and affect.     ED Treatments / Results  Labs (all labs ordered are listed, but only abnormal results are displayed) Labs Reviewed - No data to display  EKG  EKG Interpretation None       Radiology No  results found.  Procedures Dental Block Date/Time: 12/11/2016 11:46 AM Performed by: Charlesetta Shanks Authorized by: Charlesetta Shanks   Consent:    Consent obtained:  Verbal Indications:    Indications: dental abscess   Location:    Block type:  Inferior alveolar   Laterality:  Left Procedure details (see MAR for exact dosages):    Topical anesthetic:  Benzocaine spray   Needle gauge:  27 G   Anesthetic injected:  Bupivacaine 0.5% w/o epi   Injection procedure:  Anatomic landmarks palpated, anatomic landmarks identified, negative aspiration for blood and introduced needle Post-procedure details:    Outcome:  Anesthesia achieved   Patient tolerance of procedure:  Tolerated well, no immediate complications .Marland KitchenIncision and Drainage Date/Time: 12/11/2016 11:51 AM Performed by: Charlesetta Shanks Authorized by: Charlesetta Shanks   Consent:    Consent obtained:  Verbal Location:    Type:  Abscess   Location:  Mouth   Mouth location:  Alveolar process Procedure type:    Complexity:  Simple Procedure details:    Needle aspiration: yes     Needle size:  18 G   Drainage:  Purulent   Drainage amount:  Moderate   Packing materials:  None Post-procedure details:    Patient tolerance of procedure:  Tolerated well, no immediate complications Comments:     Aspirated 1/2 mL of purulent material from alveolar abscess   (including critical care time)  Medications Ordered in ED Medications  bupivacaine (MARCAINE) 0.5 % injection 10 mL (10 mLs Infiltration Given 12/11/16 1143)  Benzocaine (HURRCAINE) 20 % mouth  spray ( Mouth/Throat Given 12/11/16 1142)  clindamycin (CLEOCIN) capsule 300 mg (300 mg Oral Given 12/11/16 1016)  ibuprofen (ADVIL,MOTRIN) tablet 800 mg (800 mg Oral Given 12/11/16 1016)     Initial Impression / Assessment and Plan / ED Course  I have reviewed the triage vital signs and the nursing notes.  Pertinent labs & imaging results that were available during my care of the patient were reviewed by me and considered in my medical decision making (see chart for details).      Final Clinical Impressions(s) / ED Diagnoses   Final diagnoses:  Dental abscess   Patient presents with alveolar abscess. She is nontoxic alert. No trismus. Dental block performed and purulence aspirated with good relief of pain. Patient will be started on clindamycin due to penicillin allergy. Hydrocodone and over-the-counter Aleve for pain control. Patient reports she has a dentist with whom she will schedule follow-up. New Prescriptions New Prescriptions   CLINDAMYCIN (CLEOCIN) 150 MG CAPSULE    Take 1 capsule (150 mg total) by mouth every 6 (six) hours.   HYDROCODONE-ACETAMINOPHEN (NORCO/VICODIN) 5-325 MG TABLET    Take 1-2 tablets by mouth every 4 (four) hours as needed for moderate pain or severe pain.     Charlesetta Shanks, MD 12/11/16 1154

## 2016-12-11 NOTE — ED Triage Notes (Signed)
To ED for eval of left lower lip and left facial swelling since waking. Pt states she has a 'bad' tooth in left upper. Pain started a few days ago with minimal swelling at same time.

## 2016-12-26 DIAGNOSIS — F339 Major depressive disorder, recurrent, unspecified: Secondary | ICD-10-CM | POA: Diagnosis not present

## 2017-01-05 DIAGNOSIS — M169 Osteoarthritis of hip, unspecified: Secondary | ICD-10-CM | POA: Diagnosis not present

## 2017-01-05 DIAGNOSIS — Z79899 Other long term (current) drug therapy: Secondary | ICD-10-CM | POA: Diagnosis not present

## 2017-01-05 DIAGNOSIS — G894 Chronic pain syndrome: Secondary | ICD-10-CM | POA: Diagnosis not present

## 2017-01-05 DIAGNOSIS — M47816 Spondylosis without myelopathy or radiculopathy, lumbar region: Secondary | ICD-10-CM | POA: Diagnosis not present

## 2017-01-05 DIAGNOSIS — Z79891 Long term (current) use of opiate analgesic: Secondary | ICD-10-CM | POA: Diagnosis not present

## 2017-01-05 DIAGNOSIS — M25519 Pain in unspecified shoulder: Secondary | ICD-10-CM | POA: Diagnosis not present

## 2017-01-17 DIAGNOSIS — H401131 Primary open-angle glaucoma, bilateral, mild stage: Secondary | ICD-10-CM | POA: Diagnosis not present

## 2017-01-17 DIAGNOSIS — H04123 Dry eye syndrome of bilateral lacrimal glands: Secondary | ICD-10-CM | POA: Diagnosis not present

## 2017-01-17 DIAGNOSIS — H2513 Age-related nuclear cataract, bilateral: Secondary | ICD-10-CM | POA: Diagnosis not present

## 2017-01-17 DIAGNOSIS — E119 Type 2 diabetes mellitus without complications: Secondary | ICD-10-CM | POA: Diagnosis not present

## 2017-02-06 DIAGNOSIS — Z79899 Other long term (current) drug therapy: Secondary | ICD-10-CM | POA: Diagnosis not present

## 2017-02-06 DIAGNOSIS — M47816 Spondylosis without myelopathy or radiculopathy, lumbar region: Secondary | ICD-10-CM | POA: Diagnosis not present

## 2017-02-06 DIAGNOSIS — M542 Cervicalgia: Secondary | ICD-10-CM | POA: Diagnosis not present

## 2017-02-06 DIAGNOSIS — Z79891 Long term (current) use of opiate analgesic: Secondary | ICD-10-CM | POA: Diagnosis not present

## 2017-02-06 DIAGNOSIS — G894 Chronic pain syndrome: Secondary | ICD-10-CM | POA: Diagnosis not present

## 2017-02-06 DIAGNOSIS — M169 Osteoarthritis of hip, unspecified: Secondary | ICD-10-CM | POA: Diagnosis not present

## 2017-03-06 DIAGNOSIS — G894 Chronic pain syndrome: Secondary | ICD-10-CM | POA: Diagnosis not present

## 2017-03-06 DIAGNOSIS — Z79899 Other long term (current) drug therapy: Secondary | ICD-10-CM | POA: Diagnosis not present

## 2017-03-06 DIAGNOSIS — M47816 Spondylosis without myelopathy or radiculopathy, lumbar region: Secondary | ICD-10-CM | POA: Diagnosis not present

## 2017-03-06 DIAGNOSIS — M542 Cervicalgia: Secondary | ICD-10-CM | POA: Diagnosis not present

## 2017-03-06 DIAGNOSIS — M169 Osteoarthritis of hip, unspecified: Secondary | ICD-10-CM | POA: Diagnosis not present

## 2017-03-06 DIAGNOSIS — Z79891 Long term (current) use of opiate analgesic: Secondary | ICD-10-CM | POA: Diagnosis not present

## 2017-03-07 ENCOUNTER — Other Ambulatory Visit: Payer: Self-pay | Admitting: Internal Medicine

## 2017-03-07 DIAGNOSIS — Z1231 Encounter for screening mammogram for malignant neoplasm of breast: Secondary | ICD-10-CM

## 2017-03-21 ENCOUNTER — Ambulatory Visit: Payer: Medicare Other

## 2017-04-11 DIAGNOSIS — Z23 Encounter for immunization: Secondary | ICD-10-CM | POA: Diagnosis not present

## 2017-04-26 ENCOUNTER — Ambulatory Visit
Admission: RE | Admit: 2017-04-26 | Discharge: 2017-04-26 | Disposition: A | Discharge status: Home / Self Care | Payer: Medicare Other | Source: Ambulatory Visit | Attending: Internal Medicine | Admitting: Internal Medicine

## 2017-04-26 ENCOUNTER — Other Ambulatory Visit: Payer: Self-pay | Admitting: Physician Assistant

## 2017-04-26 ENCOUNTER — Ambulatory Visit
Admission: RE | Admit: 2017-04-26 | Discharge: 2017-04-26 | Disposition: A | Discharge status: Home / Self Care | Payer: Medicare Other | Source: Ambulatory Visit | Attending: Physician Assistant | Admitting: Physician Assistant

## 2017-04-26 DIAGNOSIS — M25551 Pain in right hip: Secondary | ICD-10-CM

## 2017-04-26 DIAGNOSIS — M160 Bilateral primary osteoarthritis of hip: Secondary | ICD-10-CM | POA: Diagnosis not present

## 2017-04-26 DIAGNOSIS — Z1231 Encounter for screening mammogram for malignant neoplasm of breast: Secondary | ICD-10-CM

## 2017-04-26 DIAGNOSIS — M25552 Pain in left hip: Principal | ICD-10-CM

## 2017-05-15 DIAGNOSIS — Z01411 Encounter for gynecological examination (general) (routine) with abnormal findings: Secondary | ICD-10-CM | POA: Diagnosis not present

## 2017-05-15 DIAGNOSIS — Z6828 Body mass index (BMI) 28.0-28.9, adult: Secondary | ICD-10-CM | POA: Diagnosis not present

## 2017-05-15 DIAGNOSIS — Z113 Encounter for screening for infections with a predominantly sexual mode of transmission: Secondary | ICD-10-CM | POA: Diagnosis not present

## 2017-05-18 DIAGNOSIS — M8589 Other specified disorders of bone density and structure, multiple sites: Secondary | ICD-10-CM | POA: Diagnosis not present

## 2017-05-24 DIAGNOSIS — Z79899 Other long term (current) drug therapy: Secondary | ICD-10-CM | POA: Diagnosis not present

## 2017-05-24 DIAGNOSIS — G894 Chronic pain syndrome: Secondary | ICD-10-CM | POA: Diagnosis not present

## 2017-05-24 DIAGNOSIS — M542 Cervicalgia: Secondary | ICD-10-CM | POA: Diagnosis not present

## 2017-05-24 DIAGNOSIS — M47816 Spondylosis without myelopathy or radiculopathy, lumbar region: Secondary | ICD-10-CM | POA: Diagnosis not present

## 2017-05-24 DIAGNOSIS — Z79891 Long term (current) use of opiate analgesic: Secondary | ICD-10-CM | POA: Diagnosis not present

## 2017-05-24 DIAGNOSIS — M25519 Pain in unspecified shoulder: Secondary | ICD-10-CM | POA: Diagnosis not present

## 2017-05-30 DIAGNOSIS — I129 Hypertensive chronic kidney disease with stage 1 through stage 4 chronic kidney disease, or unspecified chronic kidney disease: Secondary | ICD-10-CM | POA: Diagnosis not present

## 2017-05-30 DIAGNOSIS — E538 Deficiency of other specified B group vitamins: Secondary | ICD-10-CM | POA: Diagnosis not present

## 2017-05-30 DIAGNOSIS — I10 Essential (primary) hypertension: Secondary | ICD-10-CM | POA: Diagnosis not present

## 2017-05-30 DIAGNOSIS — E1122 Type 2 diabetes mellitus with diabetic chronic kidney disease: Secondary | ICD-10-CM | POA: Diagnosis not present

## 2017-06-06 DIAGNOSIS — N200 Calculus of kidney: Secondary | ICD-10-CM | POA: Diagnosis not present

## 2017-06-06 DIAGNOSIS — M8580 Other specified disorders of bone density and structure, unspecified site: Secondary | ICD-10-CM | POA: Diagnosis not present

## 2017-06-06 DIAGNOSIS — I129 Hypertensive chronic kidney disease with stage 1 through stage 4 chronic kidney disease, or unspecified chronic kidney disease: Secondary | ICD-10-CM | POA: Diagnosis not present

## 2017-06-06 DIAGNOSIS — Z1159 Encounter for screening for other viral diseases: Secondary | ICD-10-CM | POA: Diagnosis not present

## 2017-06-06 DIAGNOSIS — F339 Major depressive disorder, recurrent, unspecified: Secondary | ICD-10-CM | POA: Diagnosis not present

## 2017-06-06 DIAGNOSIS — E1122 Type 2 diabetes mellitus with diabetic chronic kidney disease: Secondary | ICD-10-CM | POA: Diagnosis not present

## 2017-06-06 DIAGNOSIS — N182 Chronic kidney disease, stage 2 (mild): Secondary | ICD-10-CM | POA: Diagnosis not present

## 2017-06-06 DIAGNOSIS — E538 Deficiency of other specified B group vitamins: Secondary | ICD-10-CM | POA: Diagnosis not present

## 2017-06-06 DIAGNOSIS — F17210 Nicotine dependence, cigarettes, uncomplicated: Secondary | ICD-10-CM | POA: Diagnosis not present

## 2017-07-05 DIAGNOSIS — G894 Chronic pain syndrome: Secondary | ICD-10-CM | POA: Diagnosis not present

## 2017-07-05 DIAGNOSIS — M169 Osteoarthritis of hip, unspecified: Secondary | ICD-10-CM | POA: Diagnosis not present

## 2017-07-05 DIAGNOSIS — M47816 Spondylosis without myelopathy or radiculopathy, lumbar region: Secondary | ICD-10-CM | POA: Diagnosis not present

## 2017-07-05 DIAGNOSIS — M25519 Pain in unspecified shoulder: Secondary | ICD-10-CM | POA: Diagnosis not present

## 2017-07-05 DIAGNOSIS — Z79899 Other long term (current) drug therapy: Secondary | ICD-10-CM | POA: Diagnosis not present

## 2017-07-05 DIAGNOSIS — Z79891 Long term (current) use of opiate analgesic: Secondary | ICD-10-CM | POA: Diagnosis not present

## 2017-08-30 DIAGNOSIS — Z79899 Other long term (current) drug therapy: Secondary | ICD-10-CM | POA: Diagnosis not present

## 2017-08-30 DIAGNOSIS — M542 Cervicalgia: Secondary | ICD-10-CM | POA: Diagnosis not present

## 2017-08-30 DIAGNOSIS — M25519 Pain in unspecified shoulder: Secondary | ICD-10-CM | POA: Diagnosis not present

## 2017-08-30 DIAGNOSIS — G894 Chronic pain syndrome: Secondary | ICD-10-CM | POA: Diagnosis not present

## 2017-08-30 DIAGNOSIS — Z79891 Long term (current) use of opiate analgesic: Secondary | ICD-10-CM | POA: Diagnosis not present

## 2017-08-30 DIAGNOSIS — M47816 Spondylosis without myelopathy or radiculopathy, lumbar region: Secondary | ICD-10-CM | POA: Diagnosis not present

## 2017-11-01 DIAGNOSIS — Z79891 Long term (current) use of opiate analgesic: Secondary | ICD-10-CM | POA: Diagnosis not present

## 2017-11-01 DIAGNOSIS — M542 Cervicalgia: Secondary | ICD-10-CM | POA: Diagnosis not present

## 2017-11-01 DIAGNOSIS — G894 Chronic pain syndrome: Secondary | ICD-10-CM | POA: Diagnosis not present

## 2017-11-01 DIAGNOSIS — Z79899 Other long term (current) drug therapy: Secondary | ICD-10-CM | POA: Diagnosis not present

## 2017-11-01 DIAGNOSIS — M47816 Spondylosis without myelopathy or radiculopathy, lumbar region: Secondary | ICD-10-CM | POA: Diagnosis not present

## 2017-11-01 DIAGNOSIS — M169 Osteoarthritis of hip, unspecified: Secondary | ICD-10-CM | POA: Diagnosis not present

## 2017-11-15 DIAGNOSIS — Z79891 Long term (current) use of opiate analgesic: Secondary | ICD-10-CM | POA: Diagnosis not present

## 2017-11-15 DIAGNOSIS — M25519 Pain in unspecified shoulder: Secondary | ICD-10-CM | POA: Diagnosis not present

## 2017-11-15 DIAGNOSIS — M47816 Spondylosis without myelopathy or radiculopathy, lumbar region: Secondary | ICD-10-CM | POA: Diagnosis not present

## 2017-11-15 DIAGNOSIS — Z79899 Other long term (current) drug therapy: Secondary | ICD-10-CM | POA: Diagnosis not present

## 2017-11-15 DIAGNOSIS — M169 Osteoarthritis of hip, unspecified: Secondary | ICD-10-CM | POA: Diagnosis not present

## 2017-11-15 DIAGNOSIS — G894 Chronic pain syndrome: Secondary | ICD-10-CM | POA: Diagnosis not present

## 2017-11-15 DIAGNOSIS — M542 Cervicalgia: Secondary | ICD-10-CM | POA: Diagnosis not present

## 2017-12-03 DIAGNOSIS — M47816 Spondylosis without myelopathy or radiculopathy, lumbar region: Secondary | ICD-10-CM | POA: Diagnosis not present

## 2017-12-03 DIAGNOSIS — M47817 Spondylosis without myelopathy or radiculopathy, lumbosacral region: Secondary | ICD-10-CM | POA: Diagnosis not present

## 2017-12-03 DIAGNOSIS — M25519 Pain in unspecified shoulder: Secondary | ICD-10-CM | POA: Diagnosis not present

## 2017-12-03 DIAGNOSIS — G894 Chronic pain syndrome: Secondary | ICD-10-CM | POA: Diagnosis not present

## 2017-12-03 DIAGNOSIS — Z79891 Long term (current) use of opiate analgesic: Secondary | ICD-10-CM | POA: Diagnosis not present

## 2017-12-03 DIAGNOSIS — Z79899 Other long term (current) drug therapy: Secondary | ICD-10-CM | POA: Diagnosis not present

## 2017-12-03 DIAGNOSIS — M542 Cervicalgia: Secondary | ICD-10-CM | POA: Diagnosis not present

## 2017-12-17 DIAGNOSIS — M47817 Spondylosis without myelopathy or radiculopathy, lumbosacral region: Secondary | ICD-10-CM | POA: Diagnosis not present

## 2018-01-03 DIAGNOSIS — G894 Chronic pain syndrome: Secondary | ICD-10-CM | POA: Diagnosis not present

## 2018-01-03 DIAGNOSIS — M47816 Spondylosis without myelopathy or radiculopathy, lumbar region: Secondary | ICD-10-CM | POA: Diagnosis not present

## 2018-03-07 DIAGNOSIS — M47816 Spondylosis without myelopathy or radiculopathy, lumbar region: Secondary | ICD-10-CM | POA: Diagnosis not present

## 2018-03-07 DIAGNOSIS — G894 Chronic pain syndrome: Secondary | ICD-10-CM | POA: Diagnosis not present

## 2018-03-07 DIAGNOSIS — Z79891 Long term (current) use of opiate analgesic: Secondary | ICD-10-CM | POA: Diagnosis not present

## 2018-03-07 DIAGNOSIS — Z79899 Other long term (current) drug therapy: Secondary | ICD-10-CM | POA: Diagnosis not present

## 2018-03-13 DIAGNOSIS — H04123 Dry eye syndrome of bilateral lacrimal glands: Secondary | ICD-10-CM | POA: Diagnosis not present

## 2018-03-13 DIAGNOSIS — H401131 Primary open-angle glaucoma, bilateral, mild stage: Secondary | ICD-10-CM | POA: Diagnosis not present

## 2018-03-13 DIAGNOSIS — H2513 Age-related nuclear cataract, bilateral: Secondary | ICD-10-CM | POA: Diagnosis not present

## 2018-03-13 DIAGNOSIS — R7303 Prediabetes: Secondary | ICD-10-CM | POA: Diagnosis not present

## 2018-03-17 DIAGNOSIS — Z23 Encounter for immunization: Secondary | ICD-10-CM | POA: Diagnosis not present

## 2018-03-28 ENCOUNTER — Other Ambulatory Visit: Payer: Self-pay | Admitting: Obstetrics and Gynecology

## 2018-03-28 DIAGNOSIS — Z1231 Encounter for screening mammogram for malignant neoplasm of breast: Secondary | ICD-10-CM

## 2018-04-09 DIAGNOSIS — J449 Chronic obstructive pulmonary disease, unspecified: Secondary | ICD-10-CM | POA: Diagnosis not present

## 2018-04-09 DIAGNOSIS — L219 Seborrheic dermatitis, unspecified: Secondary | ICD-10-CM | POA: Diagnosis not present

## 2018-04-09 DIAGNOSIS — M62838 Other muscle spasm: Secondary | ICD-10-CM | POA: Diagnosis not present

## 2018-04-09 DIAGNOSIS — F17210 Nicotine dependence, cigarettes, uncomplicated: Secondary | ICD-10-CM | POA: Diagnosis not present

## 2018-04-09 DIAGNOSIS — M797 Fibromyalgia: Secondary | ICD-10-CM | POA: Diagnosis not present

## 2018-04-09 DIAGNOSIS — E669 Obesity, unspecified: Secondary | ICD-10-CM | POA: Diagnosis not present

## 2018-04-09 DIAGNOSIS — I10 Essential (primary) hypertension: Secondary | ICD-10-CM | POA: Diagnosis not present

## 2018-04-09 DIAGNOSIS — L723 Sebaceous cyst: Secondary | ICD-10-CM | POA: Diagnosis not present

## 2018-04-11 DIAGNOSIS — Z136 Encounter for screening for cardiovascular disorders: Secondary | ICD-10-CM | POA: Diagnosis not present

## 2018-04-11 DIAGNOSIS — I10 Essential (primary) hypertension: Secondary | ICD-10-CM | POA: Diagnosis not present

## 2018-04-26 DIAGNOSIS — Z0189 Encounter for other specified special examinations: Secondary | ICD-10-CM | POA: Diagnosis not present

## 2018-05-02 ENCOUNTER — Ambulatory Visit
Admission: RE | Admit: 2018-05-02 | Discharge: 2018-05-02 | Disposition: A | Discharge status: Home / Self Care | Payer: Medicare Other | Source: Ambulatory Visit | Attending: Obstetrics and Gynecology | Admitting: Obstetrics and Gynecology

## 2018-05-02 DIAGNOSIS — Z1231 Encounter for screening mammogram for malignant neoplasm of breast: Secondary | ICD-10-CM | POA: Diagnosis not present

## 2018-06-06 DIAGNOSIS — G894 Chronic pain syndrome: Secondary | ICD-10-CM | POA: Diagnosis not present

## 2018-06-06 DIAGNOSIS — Z79899 Other long term (current) drug therapy: Secondary | ICD-10-CM | POA: Diagnosis not present

## 2018-06-06 DIAGNOSIS — M47816 Spondylosis without myelopathy or radiculopathy, lumbar region: Secondary | ICD-10-CM | POA: Diagnosis not present

## 2018-06-06 DIAGNOSIS — Z79891 Long term (current) use of opiate analgesic: Secondary | ICD-10-CM | POA: Diagnosis not present

## 2018-06-18 DIAGNOSIS — Z136 Encounter for screening for cardiovascular disorders: Secondary | ICD-10-CM | POA: Diagnosis not present

## 2018-06-18 DIAGNOSIS — N200 Calculus of kidney: Secondary | ICD-10-CM | POA: Diagnosis not present

## 2018-08-12 ENCOUNTER — Other Ambulatory Visit: Payer: Self-pay

## 2018-08-12 ENCOUNTER — Inpatient Hospital Stay (HOSPITAL_COMMUNITY)
Admission: EM | Admit: 2018-08-12 | Discharge: 2018-08-16 | DRG: 206 | Disposition: A | Discharge status: Home / Self Care | Payer: Medicare Other | Attending: Internal Medicine | Admitting: Internal Medicine

## 2018-08-12 ENCOUNTER — Encounter (HOSPITAL_COMMUNITY): Payer: Self-pay

## 2018-08-12 ENCOUNTER — Emergency Department (HOSPITAL_COMMUNITY): Payer: Medicare Other

## 2018-08-12 DIAGNOSIS — K769 Liver disease, unspecified: Secondary | ICD-10-CM | POA: Diagnosis present

## 2018-08-12 DIAGNOSIS — R918 Other nonspecific abnormal finding of lung field: Secondary | ICD-10-CM

## 2018-08-12 DIAGNOSIS — E876 Hypokalemia: Secondary | ICD-10-CM | POA: Diagnosis present

## 2018-08-12 DIAGNOSIS — F329 Major depressive disorder, single episode, unspecified: Secondary | ICD-10-CM | POA: Diagnosis present

## 2018-08-12 DIAGNOSIS — J984 Other disorders of lung: Secondary | ICD-10-CM | POA: Diagnosis not present

## 2018-08-12 DIAGNOSIS — Z8249 Family history of ischemic heart disease and other diseases of the circulatory system: Secondary | ICD-10-CM

## 2018-08-12 DIAGNOSIS — Z885 Allergy status to narcotic agent status: Secondary | ICD-10-CM

## 2018-08-12 DIAGNOSIS — Z79891 Long term (current) use of opiate analgesic: Secondary | ICD-10-CM

## 2018-08-12 DIAGNOSIS — M797 Fibromyalgia: Secondary | ICD-10-CM | POA: Diagnosis present

## 2018-08-12 DIAGNOSIS — Z90710 Acquired absence of both cervix and uterus: Secondary | ICD-10-CM

## 2018-08-12 DIAGNOSIS — Z90722 Acquired absence of ovaries, bilateral: Secondary | ICD-10-CM

## 2018-08-12 DIAGNOSIS — Z9079 Acquired absence of other genital organ(s): Secondary | ICD-10-CM

## 2018-08-12 DIAGNOSIS — E119 Type 2 diabetes mellitus without complications: Secondary | ICD-10-CM

## 2018-08-12 DIAGNOSIS — R1011 Right upper quadrant pain: Secondary | ICD-10-CM

## 2018-08-12 DIAGNOSIS — Z7982 Long term (current) use of aspirin: Secondary | ICD-10-CM

## 2018-08-12 DIAGNOSIS — I10 Essential (primary) hypertension: Secondary | ICD-10-CM | POA: Diagnosis present

## 2018-08-12 DIAGNOSIS — F17210 Nicotine dependence, cigarettes, uncomplicated: Secondary | ICD-10-CM | POA: Diagnosis present

## 2018-08-12 DIAGNOSIS — I77811 Abdominal aortic ectasia: Secondary | ICD-10-CM | POA: Diagnosis present

## 2018-08-12 DIAGNOSIS — R0789 Other chest pain: Secondary | ICD-10-CM | POA: Diagnosis not present

## 2018-08-12 DIAGNOSIS — Z716 Tobacco abuse counseling: Secondary | ICD-10-CM

## 2018-08-12 DIAGNOSIS — Z79899 Other long term (current) drug therapy: Secondary | ICD-10-CM

## 2018-08-12 DIAGNOSIS — E739 Lactose intolerance, unspecified: Secondary | ICD-10-CM | POA: Diagnosis present

## 2018-08-12 DIAGNOSIS — J449 Chronic obstructive pulmonary disease, unspecified: Secondary | ICD-10-CM | POA: Diagnosis present

## 2018-08-12 DIAGNOSIS — Z833 Family history of diabetes mellitus: Secondary | ICD-10-CM

## 2018-08-12 DIAGNOSIS — F32A Depression, unspecified: Secondary | ICD-10-CM | POA: Diagnosis present

## 2018-08-12 DIAGNOSIS — Z880 Allergy status to penicillin: Secondary | ICD-10-CM

## 2018-08-12 LAB — LIPASE, BLOOD: LIPASE: 34 U/L (ref 11–51)

## 2018-08-12 LAB — COMPREHENSIVE METABOLIC PANEL
ALK PHOS: 89 U/L (ref 38–126)
ALT: 14 U/L (ref 0–44)
ANION GAP: 10 (ref 5–15)
AST: 15 U/L (ref 15–41)
Albumin: 3.9 g/dL (ref 3.5–5.0)
BUN: 12 mg/dL (ref 8–23)
CALCIUM: 9 mg/dL (ref 8.9–10.3)
CO2: 24 mmol/L (ref 22–32)
CREATININE: 0.96 mg/dL (ref 0.44–1.00)
Chloride: 107 mmol/L (ref 98–111)
Glucose, Bld: 135 mg/dL — ABNORMAL HIGH (ref 70–99)
Potassium: 3.2 mmol/L — ABNORMAL LOW (ref 3.5–5.1)
SODIUM: 141 mmol/L (ref 135–145)
Total Bilirubin: 0.6 mg/dL (ref 0.3–1.2)
Total Protein: 7.1 g/dL (ref 6.5–8.1)

## 2018-08-12 LAB — CBC
HCT: 44.7 % (ref 36.0–46.0)
Hemoglobin: 14.3 g/dL (ref 12.0–15.0)
MCH: 29.4 pg (ref 26.0–34.0)
MCHC: 32 g/dL (ref 30.0–36.0)
MCV: 92 fL (ref 80.0–100.0)
NRBC: 0 % (ref 0.0–0.2)
Platelets: 280 10*3/uL (ref 150–400)
RBC: 4.86 MIL/uL (ref 3.87–5.11)
RDW: 13.1 % (ref 11.5–15.5)
WBC: 7.2 10*3/uL (ref 4.0–10.5)

## 2018-08-12 LAB — I-STAT TROPONIN, ED: Troponin i, poc: 0.06 ng/mL (ref 0.00–0.08)

## 2018-08-12 MED ORDER — SODIUM CHLORIDE 0.9% FLUSH
3.0000 mL | Freq: Once | INTRAVENOUS | Status: AC
  Administered 2018-08-13: 3 mL via INTRAVENOUS

## 2018-08-12 MED ORDER — FENTANYL CITRATE (PF) 100 MCG/2ML IJ SOLN
100.0000 ug | Freq: Once | INTRAMUSCULAR | Status: AC
  Administered 2018-08-12: 100 ug via INTRAVENOUS
  Filled 2018-08-12: qty 2

## 2018-08-12 MED ORDER — FENTANYL CITRATE (PF) 100 MCG/2ML IJ SOLN
100.0000 ug | Freq: Once | INTRAMUSCULAR | Status: AC
  Administered 2018-08-13: 100 ug via INTRAVENOUS
  Filled 2018-08-12: qty 2

## 2018-08-12 MED ORDER — POTASSIUM CHLORIDE CRYS ER 20 MEQ PO TBCR
40.0000 meq | EXTENDED_RELEASE_TABLET | Freq: Once | ORAL | Status: AC
  Administered 2018-08-12: 40 meq via ORAL
  Filled 2018-08-12: qty 2

## 2018-08-12 MED ORDER — ONDANSETRON HCL 4 MG/2ML IJ SOLN
4.0000 mg | Freq: Once | INTRAMUSCULAR | Status: AC
  Administered 2018-08-12: 4 mg via INTRAVENOUS
  Filled 2018-08-12: qty 2

## 2018-08-12 NOTE — ED Notes (Signed)
Pt transported to XRAY °

## 2018-08-12 NOTE — ED Triage Notes (Signed)
Pt here with central chest pain that wraps around her left side to her back.  States the pain is upper abdominal and lower chest.  A&Ox4, no nausea or emesis.

## 2018-08-12 NOTE — ED Provider Notes (Signed)
[facility] EMERGENCY DEPARTMENT Provider Note   CSN: [id number] Arrival date & time: 08/12/18  2038    History   Chief Complaint Chief Complaint  Patient presents with  . Chest Pain    HPI Sara Powell is a 68 y.o. female.     HPI  68 year old female presents with right-sided upper abdominal/lower chest pain.  It wraps around to her right back.  Started around 2 PM today.  She does not think anything she ate earlier because the symptoms.  She has had nausea without vomiting.  Any type of movement severely worsens the pain.  No diarrhea or constipation and no urinary symptoms.  There is no pain in her upper chest and there is no shortness of breath.  Past Medical History:  Diagnosis Date  . Anemia   . Arthritis   . Colitis   . Depression   . Diabetes mellitus without complication ([place])    Pre-diabeti/NO MEDS  . Fibroid    uterine  . Fibromyalgia   . Hypertension   . Lactose intolerance   . Menorrhagia     There are no active problems to display for this patient.   Past Surgical History:  Procedure Laterality Date  . ABDOMINAL HYSTERECTOMY    . BREAST BIOPSY Left   . COLONOSCOPY    . LAPAROSCOPIC SALPINGO OOPHERECTOMY    . POLYPECTOMY    . ROTATOR CUFF REPAIR     right shoulder  . sty  on eyelid     left eye/removed 2 times  . TUBAL LIGATION       OB History    Gravida  5   Para  3   Term      Preterm      AB  2   Living  3     SAB  2   TAB      Ectopic      Multiple      Live Births  3            Home Medications    Prior to Admission medications   Medication Sig Start Date End Date Taking? Authorizing Provider  aspirin 81 MG tablet Take 81 mg by mouth daily.   Yes [provider]  B Complex-Biotin-FA (B-COMPLEX PO) Take by mouth daily.   Yes [provider]  diclofenac sodium (VOLTAREN) 1 % GEL Apply 4 g topically 3 (three) times daily as needed for pain.   Yes [provider]  escitalopram (LEXAPRO) 10 MG tablet Take 10 mg by mouth daily. 07/08/18  Yes [provider]  hydrochlorothiazide (HYDRODIURIL) 25 MG tablet Take 25 mg by mouth daily.   Yes [provider]  hydrocortisone 2.5 % cream Apply 1 application topically daily as needed for itching.   Yes [provider]  Influenza Vac Split High-Dose (FLUZONE HIGH-DOSE IM) Inject into the muscle.   Yes [provider]  ketoconazole (NIZORAL) 2 % shampoo Apply 1 application topically daily as needed for rash.   Yes [provider]  latanoprost (XALATAN) 0.005 % ophthalmic solution Place 1 drop into both eyes at bedtime.    Yes [provider]  lisinopril (PRINIVIL,ZESTRIL) 20 MG tablet Take 20 mg by mouth daily.   Yes [provider]  Multiple Vitamin (MULTIVITAMIN) tablet Take 1 tablet by mouth daily.   Yes [provider]  pravastatin (PRAVACHOL) 40 MG tablet Take 40 mg by mouth daily. 06/21/18  Yes [provider]  tiZANidine (ZANAFLEX) 4 MG capsule Take 4 mg by mouth daily.    Yes [provider]  traMADol (ULTRAM) 50 MG tablet Take 50 mg by mouth daily as needed for moderate pain.    Yes [provider]  traZODone (DESYREL) 50 MG tablet Take 50 mg by mouth at bedtime.    Yes [provider]  pneumococcal 13-valent conjugate vaccine (PREVNAR 13) SUSP injection Inject 0.5 mLs into the muscle once. To be administered by a pharmacist    [provider]    Family History Family History  Problem Relation Age of Onset  . Diabetes Mother   . Hypertension Mother   . Hypertension Father   . Colon cancer Sister   . Diabetes Brother   . Colon polyps Sister   . Heart disease Maternal Aunt   . Heart attack Maternal Aunt   . Cancer Maternal Aunt        stomach  . Diabetes Paternal Aunt   . Diabetes Maternal Grandmother   . Stroke Maternal Grandmother   . Cancer Maternal Grandfather        colon  .  Colon cancer Maternal Grandfather   . Diabetes Paternal Grandmother     Social History Social History   Tobacco Use  . Smoking status: Current Every Day Smoker    Packs/day: 0.50    Types: Cigarettes  . Smokeless tobacco: Never Used  Substance Use Topics  . Alcohol use: Yes    Alcohol/week: 5.0 standard drinks    Types: 5 Standard drinks or equivalent per week    Comment: wine  . Drug use: No     Allergies   Penicillins and Tylox [oxycodone-acetaminophen]   Review of Systems Review of Systems  Constitutional: Negative for fever.  Respiratory: Negative for shortness of breath.   Cardiovascular: Negative for chest pain.  Gastrointestinal: Positive for abdominal pain and nausea. Negative for constipation, diarrhea and vomiting.  Genitourinary: Negative for dysuria.  Musculoskeletal: Positive for back pain.  All other systems reviewed and are negative.    Physical Exam Updated Vital Signs BP (!) 173/83   Pulse 64   Temp 98.8 F (37.1 C) (Oral)   Resp 12   SpO2 92%   Physical Exam Vitals signs and nursing note reviewed.  Constitutional:      Appearance: She is well-developed.  HENT:     Head: Normocephalic and atraumatic.     Right Ear: External ear normal.     Left Ear: External ear normal.     Nose: Nose normal.  Eyes:     General:        Right eye: No discharge.        Left eye: No discharge.  Cardiovascular:     Rate and Rhythm: Normal rate and regular rhythm.     Heart sounds: Normal heart sounds.  Pulmonary:     Effort: Pulmonary effort is normal.     Breath sounds: Normal breath sounds.  Abdominal:     Palpations: Abdomen is soft.     Tenderness: There is no abdominal tenderness.  Skin:    General: Skin is warm and dry.  Neurological:     Mental Status: She is alert.  Psychiatric:        Mood and Affect: Mood is not anxious.      ED Treatments / Results  Labs (all labs ordered are listed, but only abnormal results are displayed) Labs  Reviewed  COMPREHENSIVE METABOLIC PANEL - Abnormal;  Notable for the following components:      Result Value   Potassium 3.2 (*)    Glucose, Bld 135 (*)    All other components within normal limits  CBC  LIPASE, BLOOD  URINALYSIS, ROUTINE W REFLEX MICROSCOPIC  I-STAT TROPONIN, ED  I-STAT TROPONIN, ED    EKG EKG Interpretation  Date/Time:  Monday August 12 2018 20:42:26 EST Ventricular Rate:  70 PR Interval:  132 QRS Duration: 92 QT Interval:  380 QTC Calculation: 410 R Axis:   49 Text Interpretation:  Normal sinus rhythm T wave abnormality, consider anterior ischemia Abnormal ECG T wave changes similar to May 2006 Confirmed by Sherwood Gambler [phone]) on 08/12/2018 9:32:25 PM   Radiology Dg Chest 2 View  Result Date: 08/12/2018 CLINICAL DATA:  Chest pain EXAM: CHEST - 2 VIEW COMPARISON:  None. FINDINGS: Heart and mediastinal contours are within normal limits. No focal opacities or effusions. No acute bony abnormality. IMPRESSION: No active cardiopulmonary disease. Electronically Signed   By: Rolm Baptise M.D.   On: 08/12/2018 21:44   US Abdomen Limited Ruq  Result Date: 08/12/2018 CLINICAL DATA:  Initial evaluation for acute abdominal pain, right upper quadrant. EXAM: ULTRASOUND ABDOMEN LIMITED RIGHT UPPER QUADRANT COMPARISON:  None. FINDINGS: Gallbladder: No gallstones or wall thickening visualized. No sonographic Murphy sign noted by sonographer. Common bile duct: Diameter: 3.5 mm Liver: No focal lesion identified. Within normal limits in parenchymal echogenicity. Portal vein is patent on color Doppler imaging with normal direction of blood flow towards the liver. IMPRESSION: Normal right upper quadrant ultrasound. Electronically Signed   By: Jeannine Boga M.D.   On: 08/12/2018 23:35    Procedures Procedures (including critical care time)  Medications Ordered in ED Medications  iopamidol (ISOVUE-370) 76 % injection (has no administration in time range)  sodium  chloride flush (NS) 0.9 % injection 3 mL (3 mLs Intravenous Given 08/13/18 0016)  potassium chloride SA (K-DUR,KLOR-CON) CR tablet 40 mEq (40 mEq Oral Given 08/12/18 2234)  fentaNYL (SUBLIMAZE) injection 100 mcg (100 mcg Intravenous Given 08/12/18 2240)  ondansetron (ZOFRAN) injection 4 mg (4 mg Intravenous Given 08/12/18 2240)  fentaNYL (SUBLIMAZE) injection 100 mcg (100 mcg Intravenous Given 08/13/18 0016)     Initial Impression / Assessment and Plan / ED Course  I have reviewed the triage vital signs and the nursing notes.  Pertinent labs & imaging results that were available during my care of the patient were reviewed by me and considered in my medical decision making (see chart for details).        Right upper quadrant ultrasound does not show an obvious gallbladder pathology.  Given the hypertension, flank pain, I think it is reasonable to get CT and since there is chest/flank pain I think a dissection protocol will cover all of the pathology needing to see.  We will get repeat troponin though this seems less likely to be ACS.  Care to Dr. Wyvonnia Dusky with CT and repeat troponin pending.  Final Clinical Impressions(s) / ED Diagnoses   Final diagnoses:  Abdominal pain, RUQ (right upper quadrant)    ED Discharge Orders    None       Sherwood Gambler, MD 08/13/18 [phone]

## 2018-08-12 NOTE — ED Notes (Signed)
RN informed ok for Pt to receive 2 visitors

## 2018-08-13 ENCOUNTER — Encounter (HOSPITAL_COMMUNITY): Payer: Self-pay | Admitting: Family Medicine

## 2018-08-13 ENCOUNTER — Emergency Department (HOSPITAL_COMMUNITY): Payer: Medicare Other

## 2018-08-13 DIAGNOSIS — E876 Hypokalemia: Secondary | ICD-10-CM | POA: Diagnosis present

## 2018-08-13 DIAGNOSIS — F329 Major depressive disorder, single episode, unspecified: Secondary | ICD-10-CM | POA: Diagnosis not present

## 2018-08-13 DIAGNOSIS — F32A Depression, unspecified: Secondary | ICD-10-CM | POA: Diagnosis present

## 2018-08-13 DIAGNOSIS — J984 Other disorders of lung: Secondary | ICD-10-CM

## 2018-08-13 DIAGNOSIS — R918 Other nonspecific abnormal finding of lung field: Secondary | ICD-10-CM | POA: Diagnosis not present

## 2018-08-13 DIAGNOSIS — R0789 Other chest pain: Secondary | ICD-10-CM | POA: Insufficient documentation

## 2018-08-13 DIAGNOSIS — D869 Sarcoidosis, unspecified: Secondary | ICD-10-CM

## 2018-08-13 DIAGNOSIS — R1011 Right upper quadrant pain: Secondary | ICD-10-CM | POA: Diagnosis not present

## 2018-08-13 DIAGNOSIS — K769 Liver disease, unspecified: Secondary | ICD-10-CM | POA: Insufficient documentation

## 2018-08-13 DIAGNOSIS — I10 Essential (primary) hypertension: Secondary | ICD-10-CM

## 2018-08-13 LAB — BASIC METABOLIC PANEL
Anion gap: 10 (ref 5–15)
BUN: 10 mg/dL (ref 8–23)
CALCIUM: 8.8 mg/dL — AB (ref 8.9–10.3)
CO2: 25 mmol/L (ref 22–32)
Chloride: 105 mmol/L (ref 98–111)
Creatinine, Ser: 0.8 mg/dL (ref 0.44–1.00)
GFR calc non Af Amer: >60 mL/min (ref >60)
Glucose, Bld: 173 mg/dL — ABNORMAL HIGH (ref 70–99)
Potassium: 3.7 mmol/L (ref 3.5–5.1)
SODIUM: 140 mmol/L (ref 135–145)

## 2018-08-13 LAB — CBC WITH DIFFERENTIAL/PLATELET
Abs Immature Granulocytes: 0.03 10*3/uL (ref 0.00–0.07)
Basophils Absolute: 0 10*3/uL (ref 0.0–0.1)
Basophils Relative: 0 %
Eosinophils Absolute: 0.1 10*3/uL (ref 0.0–0.5)
Eosinophils Relative: 1 %
HCT: 42.9 % (ref 36.0–46.0)
Hemoglobin: 13.6 g/dL (ref 12.0–15.0)
Immature Granulocytes: 0 %
Lymphocytes Relative: 31 %
Lymphs Abs: 2.1 10*3/uL (ref 0.7–4.0)
MCH: 29.4 pg (ref 26.0–34.0)
MCHC: 31.7 g/dL (ref 30.0–36.0)
MCV: 92.7 fL (ref 80.0–100.0)
Monocytes Absolute: 0.4 10*3/uL (ref 0.1–1.0)
Monocytes Relative: 6 %
NEUTROS ABS: 4.1 10*3/uL (ref 1.7–7.7)
Neutrophils Relative %: 62 %
Platelets: 265 10*3/uL (ref 150–400)
RBC: 4.63 MIL/uL (ref 3.87–5.11)
RDW: 13.2 % (ref 11.5–15.5)
WBC: 6.8 10*3/uL (ref 4.0–10.5)
nRBC: 0 % (ref 0.0–0.2)

## 2018-08-13 LAB — LACTIC ACID, PLASMA
Lactic Acid, Venous: 1.2 mmol/L (ref 0.5–1.9)
Lactic Acid, Venous: 2.1 mmol/L (ref 0.5–1.9)
Lactic Acid, Venous: 0.9 mmol/L (ref 0.5–1.9)

## 2018-08-13 LAB — SEDIMENTATION RATE: Sed Rate: 25 mm/hr — ABNORMAL HIGH (ref 0–22)

## 2018-08-13 LAB — URINALYSIS, ROUTINE W REFLEX MICROSCOPIC
Bilirubin Urine: NEGATIVE
GLUCOSE, UA: NEGATIVE mg/dL
Hgb urine dipstick: NEGATIVE
Ketones, ur: NEGATIVE mg/dL
Leukocytes,Ua: NEGATIVE
Nitrite: NEGATIVE
PROTEIN: NEGATIVE mg/dL
Specific Gravity, Urine: 1.029 (ref 1.005–1.030)
pH: 5 (ref 5.0–8.0)

## 2018-08-13 LAB — HIV ANTIBODY (ROUTINE TESTING W REFLEX): HIV Screen 4th Generation wRfx: NONREACTIVE

## 2018-08-13 LAB — I-STAT TROPONIN, ED: Troponin i, poc: 0 ng/mL (ref 0.00–0.08)

## 2018-08-13 LAB — MAGNESIUM: Magnesium: 2.1 mg/dL (ref 1.7–2.4)

## 2018-08-13 LAB — PROCALCITONIN: Procalcitonin: <0.1 ng/mL

## 2018-08-13 MED ORDER — PRAVASTATIN SODIUM 40 MG PO TABS
40.0000 mg | ORAL_TABLET | Freq: Every day | ORAL | Status: DC
  Administered 2018-08-13 – 2018-08-15 (×3): 40 mg via ORAL
  Filled 2018-08-13 (×3): qty 1

## 2018-08-13 MED ORDER — SODIUM CHLORIDE 0.9% FLUSH: 3.0000 mL | INTRAVENOUS | Status: DC | PRN

## 2018-08-13 MED ORDER — IOPAMIDOL (ISOVUE-370) INJECTION 76%
100.0000 mL | Freq: Once | INTRAVENOUS | Status: AC | PRN
  Administered 2018-08-13: 100 mL via INTRAVENOUS

## 2018-08-13 MED ORDER — KETOROLAC TROMETHAMINE 15 MG/ML IJ SOLN
15.0000 mg | Freq: Four times a day (QID) | INTRAMUSCULAR | Status: DC | PRN
  Administered 2018-08-13 – 2018-08-14 (×3): 15 mg via INTRAVENOUS
  Filled 2018-08-13 (×3): qty 1

## 2018-08-13 MED ORDER — ACETAMINOPHEN 650 MG RE SUPP: 650.0000 mg | Freq: Four times a day (QID) | RECTAL | Status: DC | PRN

## 2018-08-13 MED ORDER — HYDROCHLOROTHIAZIDE 25 MG PO TABS
25.0000 mg | ORAL_TABLET | Freq: Every day | ORAL | Status: DC
  Administered 2018-08-13 – 2018-08-16 (×4): 25 mg via ORAL
  Filled 2018-08-13 (×4): qty 1

## 2018-08-13 MED ORDER — CLINDAMYCIN PHOSPHATE 600 MG/50ML IV SOLN
600.0000 mg | Freq: Once | INTRAVENOUS | Status: AC
  Administered 2018-08-13: 600 mg via INTRAVENOUS
  Filled 2018-08-13: qty 50

## 2018-08-13 MED ORDER — TRAMADOL HCL 50 MG PO TABS
50.0000 mg | ORAL_TABLET | Freq: Four times a day (QID) | ORAL | Status: DC | PRN
  Administered 2018-08-13 – 2018-08-15 (×5): 50 mg via ORAL
  Filled 2018-08-13 (×5): qty 1

## 2018-08-13 MED ORDER — POLYETHYLENE GLYCOL 3350 17 G PO PACK
17.0000 g | PACK | Freq: Every day | ORAL | Status: DC | PRN
  Administered 2018-08-14: 17 g via ORAL
  Filled 2018-08-13: qty 1

## 2018-08-13 MED ORDER — SODIUM CHLORIDE 0.9 % IV SOLN: 2.0000 g | Freq: Once | INTRAVENOUS | Status: DC

## 2018-08-13 MED ORDER — SODIUM CHLORIDE 0.9 % IV SOLN
1.0000 g | Freq: Three times a day (TID) | INTRAVENOUS | Status: DC
  Filled 2018-08-13: qty 1

## 2018-08-13 MED ORDER — IOPAMIDOL (ISOVUE-370) INJECTION 76%
INTRAVENOUS | Status: AC
  Filled 2018-08-13: qty 100

## 2018-08-13 MED ORDER — TIZANIDINE HCL 4 MG PO TABS
4.0000 mg | ORAL_TABLET | Freq: Every day | ORAL | Status: DC
  Administered 2018-08-13 – 2018-08-16 (×4): 4 mg via ORAL
  Filled 2018-08-13 (×3): qty 2
  Filled 2018-08-13: qty 1
  Filled 2018-08-13: qty 2
  Filled 2018-08-13 (×3): qty 1

## 2018-08-13 MED ORDER — FENTANYL CITRATE (PF) 100 MCG/2ML IJ SOLN
12.5000 ug | INTRAMUSCULAR | Status: DC | PRN
  Administered 2018-08-13: 12.5 ug via INTRAVENOUS
  Filled 2018-08-13: qty 2

## 2018-08-13 MED ORDER — CLINDAMYCIN PHOSPHATE 600 MG/50ML IV SOLN
600.0000 mg | Freq: Three times a day (TID) | INTRAVENOUS | Status: DC
  Administered 2018-08-13: 600 mg via INTRAVENOUS
  Filled 2018-08-13: qty 50

## 2018-08-13 MED ORDER — SODIUM CHLORIDE 0.9 % IV SOLN: 250.0000 mL | INTRAVENOUS | Status: DC | PRN

## 2018-08-13 MED ORDER — LISINOPRIL 20 MG PO TABS
20.0000 mg | ORAL_TABLET | Freq: Every day | ORAL | Status: DC
  Administered 2018-08-13 – 2018-08-16 (×4): 20 mg via ORAL
  Filled 2018-08-13 (×4): qty 1

## 2018-08-13 MED ORDER — ENOXAPARIN SODIUM 40 MG/0.4ML ~~LOC~~ SOLN
40.0000 mg | SUBCUTANEOUS | Status: DC
  Administered 2018-08-13 – 2018-08-15 (×3): 40 mg via SUBCUTANEOUS
  Filled 2018-08-13 (×3): qty 0.4

## 2018-08-13 MED ORDER — ESCITALOPRAM OXALATE 10 MG PO TABS
10.0000 mg | ORAL_TABLET | Freq: Every day | ORAL | Status: DC
  Administered 2018-08-13 – 2018-08-16 (×4): 10 mg via ORAL
  Filled 2018-08-13 (×4): qty 1

## 2018-08-13 MED ORDER — ACETAMINOPHEN 325 MG PO TABS: 650.0000 mg | ORAL_TABLET | Freq: Four times a day (QID) | ORAL | Status: DC | PRN

## 2018-08-13 MED ORDER — ONDANSETRON HCL 4 MG PO TABS: 4.0000 mg | ORAL_TABLET | Freq: Four times a day (QID) | ORAL | Status: DC | PRN

## 2018-08-13 MED ORDER — SULFAMETHOXAZOLE-TRIMETHOPRIM 800-160 MG PO TABS: 1.0000 | ORAL_TABLET | Freq: Two times a day (BID) | ORAL | Status: DC

## 2018-08-13 MED ORDER — TRAZODONE HCL 50 MG PO TABS
50.0000 mg | ORAL_TABLET | Freq: Every day | ORAL | Status: DC
  Administered 2018-08-13 – 2018-08-15 (×3): 50 mg via ORAL
  Filled 2018-08-13 (×3): qty 1

## 2018-08-13 MED ORDER — DOXYCYCLINE HYCLATE 100 MG PO TABS
100.0000 mg | ORAL_TABLET | Freq: Two times a day (BID) | ORAL | Status: DC
  Administered 2018-08-13 – 2018-08-16 (×7): 100 mg via ORAL
  Filled 2018-08-13 (×7): qty 1

## 2018-08-13 MED ORDER — SODIUM CHLORIDE 0.9% FLUSH
3.0000 mL | Freq: Two times a day (BID) | INTRAVENOUS | Status: DC
  Administered 2018-08-13 – 2018-08-16 (×8): 3 mL via INTRAVENOUS

## 2018-08-13 MED ORDER — ONDANSETRON HCL 4 MG/2ML IJ SOLN: 4.0000 mg | Freq: Four times a day (QID) | INTRAMUSCULAR | Status: DC | PRN

## 2018-08-13 MED ORDER — VANCOMYCIN HCL 10 G IV SOLR
1500.0000 mg | Freq: Once | INTRAVENOUS | Status: DC
  Filled 2018-08-13: qty 1500

## 2018-08-13 MED ORDER — SODIUM CHLORIDE 0.9 % IV BOLUS
500.0000 mL | Freq: Once | INTRAVENOUS | Status: AC
  Administered 2018-08-13: 500 mL via INTRAVENOUS

## 2018-08-13 MED ORDER — ASPIRIN 81 MG PO CHEW
81.0000 mg | CHEWABLE_TABLET | Freq: Every day | ORAL | Status: DC
  Administered 2018-08-13 – 2018-08-16 (×4): 81 mg via ORAL
  Filled 2018-08-13 (×4): qty 1

## 2018-08-13 MED ORDER — VANCOMYCIN HCL 10 G IV SOLR: 1500.0000 mg | INTRAVENOUS | Status: DC

## 2018-08-13 MED ORDER — LATANOPROST 0.005 % OP SOLN
1.0000 [drp] | Freq: Every day | OPHTHALMIC | Status: DC
  Administered 2018-08-13 – 2018-08-15 (×3): 1 [drp] via OPHTHALMIC
  Filled 2018-08-13: qty 2.5

## 2018-08-13 MED ORDER — SODIUM CHLORIDE 0.9 % IV SOLN
3.0000 g | Freq: Three times a day (TID) | INTRAVENOUS | Status: DC
  Filled 2018-08-13 (×2): qty 3

## 2018-08-13 MED ORDER — DICLOFENAC SODIUM 1 % TD GEL: 4.0000 g | Freq: Three times a day (TID) | TRANSDERMAL | Status: DC | PRN

## 2018-08-13 NOTE — Progress Notes (Signed)
Received report from Robert,RN. Room ready for patient. Sara Dingus [name], RN 

## 2018-08-13 NOTE — Progress Notes (Addendum)
Patient admitted after midnight.  Chest with chest pain and found to have multiple pulmonary nodules with cavitation.  No weight loss, no fevers, no night sweats.  +smoking history.  +dental infections (recently obtained dental insurance and is due to have tooth removed soon)  Also had dental abscess last year.   Blood cultures pending.  Doubt TB, ? Septic emboli vs abscesses PCCM consult as not able to produce sputum and is on TB precautions.   Quantiferon gold pending...  Eulogio Bear DO  Spoke with Dr. Nelda Marseille as well as Dr. Baxter Flattery.  Doubt TB.  Will d/c airborne as well as sputum as she is not coughing.

## 2018-08-13 NOTE — ED Notes (Signed)
Dr. Myna Hidalgo ( admitting MD ) notified on pt.'s elevated Lactic Acid result.

## 2018-08-13 NOTE — ED Notes (Signed)
Attempted to call report

## 2018-08-13 NOTE — ED Notes (Signed)
Breakfast Tray Ordered. 

## 2018-08-13 NOTE — Progress Notes (Signed)
New Admission Note:   Arrival Method: Stretcher from ED Mental Orientation: alert and oriented x4  Telemetry: NA Assessment: Completed Skin: Intact IV: right AC NSL  Pain: 0  Tubes: None Safety Measures: Safety Fall Prevention Plan has been discussed  Admission: complete  5 [place] Orientation: Patient has been orientated to the room, unit and staff.   Family: at bedside   Orders to be reviewed and implemented. Will continue to monitor the patient. Call light has been placed within reach and bed alarm has been activated.   Baldo Ash, RN

## 2018-08-13 NOTE — ED Notes (Signed)
Informed pt we need urine sample 

## 2018-08-13 NOTE — Progress Notes (Addendum)
Pharmacy Antibiotic Note  Sara Powell is a 68 y.o. female admitted on 08/12/2018 with pneumonia.  Pharmacy has been consulted for unasyn  Plan: Unasyn 3gm IV q8 hours F/u renal function, cultures and clinical course     Temp (24hrs), Avg:98.8 F (37.1 C), Min:98.8 F (37.1 C), Max:98.8 F (37.1 C)  Recent Labs  Lab 08/12/18 2104  WBC 7.2  CREATININE 0.96    CrCl cannot be calculated (Unknown ideal weight.).    Allergies  Allergen Reactions  . Penicillins Hives and Itching    Did it involve swelling of the face/tongue/throat, SOB, or low BP? No Did it involve sudden or severe rash/hives, skin peeling, or any reaction on the inside of your mouth or nose? Yes Did you need to seek medical attention at a hospital or doctor's office? No When did it last happen?More than 10 years ago If all above answers are "NO", may proceed with cephalosporin use.   Roselee Culver [Oxycodone-Acetaminophen] Hives and Itching    Thank you for allowing pharmacy to be a part of this patient's care.  Excell Seltzer Poteet 08/13/2018 2:01 AM

## 2018-08-13 NOTE — Consult Note (Addendum)
NAME:  Sara Powell, MRN:  [id number], DOB:  1951-02-08, LOS: 0 ADMISSION DATE:  08/12/2018, CONSULTATION DATE:  08/13/18 REFERRING MD:  Dr. Eliseo Squires, CHIEF COMPLAINT:  Chest pain  Brief History   68 y/o F who presented to [facility] on 2/24 with complaints of right sided chest / rib and upper abdominal pain.  Troponin, EKG negative.  Work up neg for PE but showed innumerable pulmonary nodules with cavitation.  History of present illness   68 y/o F who presented to [facility] on 2/24 with complaints of right sided chest / rib and upper abdominal pain.    Patient reported on admission that her pain began on the same day around 2 in the afternoon.  She reported that movement worsens the pain.  She denied shortness of breath, cough, sputum production, fevers, chills, nausea, vomiting, diarrhea.  She denies weight loss, night sweats, hemoptysis.  She denies known sick contacts and has not traveled since 2017, no mold exposures.  She reports she has not had dental insurance and has been unable to see a dentist.  She has multiple dark areas on her teeth but no broken teeth to her knowledge.  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  She is a smoker and began at age 59 and continues to smoke half a pack per day.  At her heaviest she smoked up to 1 pack/day.  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  She carries a diagnosis of fibromyalgia.  She believes her grandmother had rheumatoid arthritis.  Initial work-up notable for troponin, EKG negative.  Work up included a CTA chest / abd / pelvis which was negative for PE but showed innumerable pulmonary nodules with cavitation.  Patient was treated with clindamycin and placed on contact/airborne precautions.  QuantiFERON gold is pending.  PCCM consulted for evaluation.  Past Medical History  Pre-diabetes Depression  HTN Anemia  Fibromyalgia    Significant Hospital Events   2/24 Admit  2/25 PCCM consulted   Consults:  PCCM   Procedures:    Significant Diagnostic Tests:  CTA Chest / ABD / Pelvis 2/24 >> no aortic dissection or aneurysm, negative for PE. 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  Micro Data:  BCx2 2/25 >>  AFB 2/25 >>  HIV 2/25 >>  Quantiferon Gold 2/25 >>   Antimicrobials:  Clindamycin 2/24 >> 2/25 Doxy 2/25 >>   Interim history/subjective:  As above  Objective   Blood pressure (!) 167/93, pulse (!) 56, temperature 98.5 F (36.9 C), temperature source Oral, resp. rate 18, SpO2 97 %.        Intake/Output Summary (Last 24 hours) at 08/13/2018 1314 Last data filed at 08/13/2018 1112 Gross per 24 hour  Intake 240 ml  Output 300 ml  Net -60 ml   There were no vitals filed for this visit.  Examination: General: Adult female in no acute distress lying in bed.  Children at bedside. HEENT: MM pink/moist, no JVD, left eyelid scar from prior biopsy Neuro: AAO x4, MAE CV: s1s2 rrr, no m/r/g PULM: even/non-labored, lungs bilaterally clear IO:EVOJ, non-tender, bsx4 active  Extremities: warm/dry, no edema  Skin: no rashes or lesions.    Resolved Hospital Problem list      Assessment & Plan:   Innumerable Pulmonary Nodules with Central Cavitation  DDx includes chronic microaspiration with small areas  of cavitation from poor dentition, suspected autoimmune process given constellation of symptoms to include dry eyes, prior biopsy with granulomas, dx of fibromyalgia, small bilateral pulmonary nodules on CT with cavitation and hepatic lesions.  Less likely tuberculosis.  Must also rule out malignancy given smoking history / liver lesions. P: Treat for 7 days as possible infectious process Narrow to doxycyline to avoid GI risk with clindamycin.  Pt allergic to PCN, doxycycline does have some anaerobic coverage.  Return to  pulmonary clinic for evaluation of COPD and repeat imaging of pulmonary nodules May need bronchoscopy with lymph node biopsy at some point Does not have history to support TB or symptoms  Follow up Quantiferon GOLD Assess autoimmune panel     COPD  -without acute exacerbation -No PFTs on file but mild emphysematous disease on CT P: Return to clinic for evaluation of COPD with PFTs Smoking cessation counseling completed  Liver Lesions  -noted on CT imaging P: Will need follow up imaging May need to consider PET CT    Best practice:  Diet: Per primary  DVT prophylaxis: lovenox  GI prophylaxis: n/a Glucose control: n/a  Mobility: as tolerated  Code Status: Full Code  Family Communication: Patient and family updated on plan of care Disposition: Per TR[facility]  Labs   CBC: Recent Labs  Lab 08/12/18 2104 08/13/18 0332  WBC 7.2 6.8  NEUTROABS  --  4.1  HGB 14.3 13.6  HCT 44.7 42.9  MCV 92.0 92.7  PLT 280 161    Basic Metabolic Panel: Recent Labs  Lab 08/12/18 2104 08/13/18 0332  NA 141 140  K 3.2* 3.7  CL 107 105  CO2 24 25  GLUCOSE 135* 173*  BUN 12 10  CREATININE 0.96 0.80  CALCIUM 9.0 8.8*  MG  --  2.1   GFR: CrCl cannot be calculated (Unknown ideal weight.). Recent Labs  Lab 08/12/18 2104 08/13/18 0205 08/13/18 0332 08/13/18 0834  PROCALCITON  --   --  <0.10  --   WBC 7.2  --  6.8  --   LATICACIDVEN  --  1.2 2.1* 0.9    Liver Function Tests: Recent Labs  Lab 08/12/18 2104  AST 15  ALT 14  ALKPHOS 89  BILITOT 0.6  PROT 7.1  ALBUMIN 3.9   Recent Labs  Lab 08/12/18 2104  LIPASE 34   No results for input(s): AMMONIA in the last 168 hours.  ABG No results found for: PHART, PCO2ART, PO2ART, HCO3, TCO2, ACIDBASEDEF, O2SAT   Coagulation Profile: No results for input(s): INR, PROTIME in the last 168 hours.  Cardiac Enzymes: No results for input(s): CKTOTAL, CKMB, CKMBINDEX, TROPONINI in the last 168 hours.  HbA1C: No results found for:  HGBA1C  CBG: No results for input(s): GLUCAP in the last 168 hours.  Review of Systems: Positives in [place]  Gen: Denies fever, chills, weight change, fatigue, night sweats HEENT: Denies blurred vision, double vision, hearing loss, tinnitus, sinus congestion, rhinorrhea, sore throat, neck stiffness, dysphagia PULM: Denies shortness of breath, cough, sputum production, hemoptysis, wheezing CV: Denies right sided chest pain, edema, orthopnea, paroxysmal nocturnal dyspnea, palpitations GI: Denies abdominal pain, nausea, vomiting, diarrhea, hematochezia, melena, constipation, change in bowel habits GU: Denies dysuria, hematuria, polyuria, oliguria, urethral discharge Endocrine: Denies hot or cold intolerance, polyuria, polyphagia or appetite change Derm: Denies rash, dry skin, scaling or peeling skin change Heme: Denies easy bruising, bleeding, bleeding gums Neuro: Denies headache, numbness, weakness, slurred speech, loss of memory or consciousness   Past Medical  History  She,  has a past medical history of Anemia, Arthritis, Colitis, Depression, Diabetes mellitus without complication ([place]), Fibroid, Fibromyalgia, Hypertension, Lactose intolerance, and Menorrhagia.   Surgical History    Past Surgical History:  Procedure Laterality Date  . ABDOMINAL HYSTERECTOMY    . BREAST BIOPSY Left   . COLONOSCOPY    . LAPAROSCOPIC SALPINGO OOPHERECTOMY    . POLYPECTOMY    . ROTATOR CUFF REPAIR     right shoulder  . sty  on eyelid     left eye/removed 2 times  . TUBAL LIGATION       Social History   reports that she has been smoking cigarettes. She has been smoking about 0.50 packs per day. She has never used smokeless tobacco. She reports current alcohol use of about 5.0 standard drinks of alcohol per week. She reports that she does not use drugs.   Family History   Her family history includes Cancer in her maternal aunt and maternal grandfather; Colon cancer in her maternal grandfather and  sister; Colon polyps in her sister; Diabetes in her brother, maternal grandmother, mother, paternal aunt, and paternal grandmother; Heart attack in her maternal aunt; Heart disease in her maternal aunt; Hypertension in her father and mother; Stroke in her maternal grandmother.   Allergies Allergies  Allergen Reactions  . Penicillins Hives and Itching    Did it involve swelling of the face/tongue/throat, SOB, or low BP? No Did it involve sudden or severe rash/hives, skin peeling, or any reaction on the inside of your mouth or nose? Yes Did you need to seek medical attention at a hospital or doctor's office? No When did it last happen?More than 10 years ago If all above answers are "NO", may proceed with cephalosporin use.   Roselee Culver [Oxycodone-Acetaminophen] Hives and Itching     Home Medications  Prior to Admission medications   Medication Sig Start Date End Date Taking? Authorizing Provider  aspirin 81 MG tablet Take 81 mg by mouth daily.   Yes [provider]  B Complex-Biotin-FA (B-COMPLEX PO) Take by mouth daily.   Yes [provider]  diclofenac sodium (VOLTAREN) 1 % GEL Apply 4 g topically 3 (three) times daily as needed for pain.   Yes [provider]  escitalopram (LEXAPRO) 10 MG tablet Take 10 mg by mouth daily. 07/08/18  Yes [provider]  hydrochlorothiazide (HYDRODIURIL) 25 MG tablet Take 25 mg by mouth daily.   Yes [provider]  hydrocortisone 2.5 % cream Apply 1 application topically daily as needed for itching.   Yes [provider]  Influenza Vac Split High-Dose (FLUZONE HIGH-DOSE IM) Inject into the muscle.   Yes [provider]  ketoconazole (NIZORAL) 2 % shampoo Apply 1 application topically daily as needed for rash.   Yes [provider]  latanoprost (XALATAN) 0.005 % ophthalmic solution Place 1 drop into both eyes at bedtime.    Yes [provider]  lisinopril (PRINIVIL,ZESTRIL)  20 MG tablet Take 20 mg by mouth daily.   Yes [provider]  Multiple Vitamin (MULTIVITAMIN) tablet Take 1 tablet by mouth daily.   Yes [provider]  pravastatin (PRAVACHOL) 40 MG tablet Take 40 mg by mouth daily. 06/21/18  Yes [provider]  tiZANidine (ZANAFLEX) 4 MG capsule Take 4 mg by mouth daily.    Yes [provider]  traMADol (ULTRAM) 50 MG tablet Take 50 mg by mouth daily as needed for moderate pain.    Yes  [provider]  traZODone (DESYREL) 50 MG tablet Take 50 mg by mouth at bedtime.    Yes [provider]  pneumococcal 13-valent conjugate vaccine (PREVNAR 13) SUSP injection Inject 0.5 mLs into the muscle once. To be administered by a pharmacist    [provider]     Critical care time: n/a    Noe Gens, NP-C [facility] Pgr: [phone] or if no answer [phone] 08/13/2018, 1:14 PM  Attending Note:  68 year old female with PMH of granulomatous changes over her eyes on a biopsy 2017 that presenting with chest pain on the right and RUQ pain.  The patient had a CT done that I reviewed myself that showed diffuse small nodules with some "cavitation" likely sarcoidosis changes.  On exam, lungs are clear.  Discussed with PCCM-NP.  TB: concern raised by EDP, no physical or historical findings to suggest TB  - F/u on quantiferon gold, if elevated will consider bronchoscopy  - This is not TB in my opinion, no need for bronch now, recommend consulting with ID  Emphysema:  - Will need PFTs as outpatient  - F/u with pulmonary as outpatient  ?Sarcoidosis:  - Autoimmune panel  - No steroids for now  - F/U as outpatient  - Will need f/u in 4-6 wks with CT prior to f/u with pulmonary  ?Cavitary pneumonia with poor dentition:  - D/C clinda due to concern for C diff  - Doxycycline and f/u on cultures  - Will need f/u imaging as outpatient  - Will need dentition addressed as outpatient  PCCM  will be available PRN  Patient seen and examined, agree with above note.  I dictated the care and orders written for this patient under my direction.  Rush Farmer, [place]

## 2018-08-13 NOTE — H&P (Signed)
History and Physical    Sara Powell RCV:[id number] DOB: Apr 30, 1951 DOA: 08/12/2018  PCP: Buzzy Han, MD   Patient coming from: Home   Chief Complaint: Pain in right chest and upper abdomen   HPI: Sara Powell is a 68 y.o. female with medical history significant for depression and hypertension, now presenting to the emergency department for evaluation of pain in the chest and upper abdomen.  She reports been in her usual state of health until the acute onset of pain in the right lower chest and right upper quadrant abdomen this afternoon.  Pain has been constant, waxing and waning, severe at times, sharp in character, radiating around the right side towards her back, and without any alleviating or exacerbating factors identified.  She cannot identify any precipitating event or trauma.  There is no associated nausea, vomiting, or diarrhea.  She denies any pleuritic component.  She denies shortness of breath or cough.  She denies fevers or chills.  She denies sweats or weight loss.  She rarely drinks alcohol, has not traveled anywhere recently, does not know anyone who has had TB, has not been incarcerated, and not homeless or working in homeless shelter.  ED Course: Upon arrival to the ED, patient is found to be afebrile, saturating low 90s on room air, slightly tachypneic, and hypertensive to 190/100.  EKG features sinus rhythm with nonspecific T wave abnormality in the anterolateral leads.  Chest x-ray is negative for acute cardiopulmonary disease.  Chemistry panel is notable for potassium of 3.2 and CBC is unremarkable.  Troponin is normal x2.  Urinalysis unremarkable.  Right upper quadrant ultrasound is a normal study.  CTA chest/abdomen/pelvis was performed and negative for aortic dissection, aneurysm, or PE, but concerning for innumerable pulmonary nodules with central cavitation as well as indeterminate liver lesions.  Blood cultures were collected in the ED and the patient  was treated with 40 mEq oral potassium, fentanyl, cefepime, and vancomycin.  QuantiFERON gold was sent from the ED.  She remains hemodynamically stable, is in no apparent respiratory distress, and will be observed for further evaluation and management.  Review of Systems:  All other systems reviewed and apart from HPI, are negative.  Past Medical History:  Diagnosis Date  . Anemia   . Arthritis   . Colitis   . Depression   . Diabetes mellitus without complication ([place])    Pre-diabeti/NO MEDS  . Fibroid    uterine  . Fibromyalgia   . Hypertension   . Lactose intolerance   . Menorrhagia     Past Surgical History:  Procedure Laterality Date  . ABDOMINAL HYSTERECTOMY    . BREAST BIOPSY Left   . COLONOSCOPY    . LAPAROSCOPIC SALPINGO OOPHERECTOMY    . POLYPECTOMY    . ROTATOR CUFF REPAIR     right shoulder  . sty  on eyelid     left eye/removed 2 times  . TUBAL LIGATION       reports that she has been smoking cigarettes. She has been smoking about 0.50 packs per day. She has never used smokeless tobacco. She reports current alcohol use of about 5.0 standard drinks of alcohol per week. She reports that she does not use drugs.  Allergies  Allergen Reactions  . Penicillins Hives and Itching    Did it involve swelling of the face/tongue/throat, SOB, or low BP? No Did it involve sudden or severe rash/hives, skin peeling, or any reaction on the inside of your mouth or nose?  Yes Did you need to seek medical attention at a hospital or doctor's office? No When did it last happen?More than 10 years ago If all above answers are "NO", may proceed with cephalosporin use.   Roselee Culver [Oxycodone-Acetaminophen] Hives and Itching    Family History  Problem Relation Age of Onset  . Diabetes Mother   . Hypertension Mother   . Hypertension Father   . Colon cancer Sister   . Diabetes Brother   . Colon polyps Sister   . Heart disease Maternal Aunt   . Heart attack Maternal Aunt     . Cancer Maternal Aunt        stomach  . Diabetes Paternal Aunt   . Diabetes Maternal Grandmother   . Stroke Maternal Grandmother   . Cancer Maternal Grandfather        colon  . Colon cancer Maternal Grandfather   . Diabetes Paternal Grandmother      Prior to Admission medications   Medication Sig Start Date End Date Taking? Authorizing Provider  aspirin 81 MG tablet Take 81 mg by mouth daily.   Yes [provider]  B Complex-Biotin-FA (B-COMPLEX PO) Take by mouth daily.   Yes [provider]  diclofenac sodium (VOLTAREN) 1 % GEL Apply 4 g topically 3 (three) times daily as needed for pain.   Yes [provider]  escitalopram (LEXAPRO) 10 MG tablet Take 10 mg by mouth daily. 07/08/18  Yes [provider]  hydrochlorothiazide (HYDRODIURIL) 25 MG tablet Take 25 mg by mouth daily.   Yes [provider]  hydrocortisone 2.5 % cream Apply 1 application topically daily as needed for itching.   Yes [provider]  Influenza Vac Split High-Dose (FLUZONE HIGH-DOSE IM) Inject into the muscle.   Yes [provider]  ketoconazole (NIZORAL) 2 % shampoo Apply 1 application topically daily as needed for rash.   Yes [provider]  latanoprost (XALATAN) 0.005 % ophthalmic solution Place 1 drop into both eyes at bedtime.    Yes [provider]  lisinopril (PRINIVIL,ZESTRIL) 20 MG tablet Take 20 mg by mouth daily.   Yes [provider]  Multiple Vitamin (MULTIVITAMIN) tablet Take 1 tablet by mouth daily.   Yes [provider]  pravastatin (PRAVACHOL) 40 MG tablet Take 40 mg by mouth daily. 06/21/18  Yes [provider]  tiZANidine (ZANAFLEX) 4 MG capsule Take 4 mg by mouth daily.    Yes [provider]  traMADol (ULTRAM) 50 MG tablet Take 50 mg by mouth daily as needed for moderate pain.    Yes [provider]  traZODone (DESYREL) 50 MG tablet Take 50 mg by mouth at bedtime.    Yes  [provider]  pneumococcal 13-valent conjugate vaccine (PREVNAR 13) SUSP injection Inject 0.5 mLs into the muscle once. To be administered by a pharmacist    [provider]    Physical Exam: Vitals:   08/13/18 0030 08/13/18 0045 08/13/18 0100 08/13/18 0115  BP:  (!) 155/74    Pulse: 65 65  63  Resp: (!) 24 (!) 9 15 11   Temp:      TempSrc:      SpO2: 92% 94%  93%    Constitutional: NAD, calm  Eyes: PERTLA, lids and conjunctivae normal ENMT: Mucous membranes are moist. Posterior pharynx clear of any exudate or lesions.   Neck: normal, supple, no masses, no thyromegaly Respiratory: clear to auscultation bilaterally, no wheezing, no crackles. Normal respiratory effort.  Cardiovascular: S1 & S2 heard, regular rate and rhythm. No extremity edema.   Abdomen: No distension, no tenderness, soft. Bowel sounds active.  Musculoskeletal: no clubbing / cyanosis. No joint deformity upper and lower extremities.    Skin: no significant rashes, lesions, ulcers. Warm, dry, well-perfused. Neurologic: no facial asymmetry. Sensation intact. Moving all extremities.  Psychiatric: Alert and oriented x 3. Pleasant and cooperative.    Labs on Admission: I have personally reviewed following labs and imaging studies  CBC: Recent Labs  Lab 08/12/18 2104  WBC 7.2  HGB 14.3  HCT 44.7  MCV 92.0  PLT 208   Basic Metabolic Panel: Recent Labs  Lab 08/12/18 2104  NA 141  K 3.2*  CL 107  CO2 24  GLUCOSE 135*  BUN 12  CREATININE 0.96  CALCIUM 9.0   GFR: CrCl cannot be calculated (Unknown ideal weight.). Liver Function Tests: Recent Labs  Lab 08/12/18 2104  AST 15  ALT 14  ALKPHOS 89  BILITOT 0.6  PROT 7.1  ALBUMIN 3.9   Recent Labs  Lab 08/12/18 2104  LIPASE 34   No results for input(s): AMMONIA in the last 168 hours. Coagulation Profile: No results for input(s): INR, PROTIME in the last 168 hours. Cardiac Enzymes: No results for input(s): CKTOTAL, CKMB,  CKMBINDEX, TROPONINI in the last 168 hours. BNP (last 3 results) No results for input(s): PROBNP in the last 8760 hours. HbA1C: No results for input(s): HGBA1C in the last 72 hours. CBG: No results for input(s): GLUCAP in the last 168 hours. Lipid Profile: No results for input(s): CHOL, HDL, LDLCALC, TRIG, CHOLHDL, LDLDIRECT in the last 72 hours. Thyroid Function Tests: No results for input(s): TSH, T4TOTAL, FREET4, T3FREE, THYROIDAB in the last 72 hours. Anemia Panel: No results for input(s): VITAMINB12, FOLATE, FERRITIN, TIBC, IRON, RETICCTPCT in the last 72 hours. Urine analysis:    Component Value Date/Time   COLORURINE YELLOW 08/13/2018 0105   APPEARANCEUR CLEAR 08/13/2018 0105   LABSPEC 1.029 08/13/2018 0105   PHURINE 5.0 08/13/2018 0105   GLUCOSEU NEGATIVE 08/13/2018 0105   HGBUR NEGATIVE 08/13/2018 0105   BILIRUBINUR NEGATIVE 08/13/2018 0105   KETONESUR NEGATIVE 08/13/2018 0105   PROTEINUR NEGATIVE 08/13/2018 0105   NITRITE NEGATIVE 08/13/2018 0105   LEUKOCYTESUR NEGATIVE 08/13/2018 0105   Sepsis Labs: @LABRCNTIP (procalcitonin:4,lacticidven:4) )No results found for this or any previous visit (from the past 240 hour(s)).   Radiological Exams on Admission: Dg Chest 2 View  Result Date: 08/12/2018 CLINICAL DATA:  Chest pain EXAM: CHEST - 2 VIEW COMPARISON:  None. FINDINGS: Heart and mediastinal contours are within normal limits. No focal opacities or effusions. No acute bony abnormality. IMPRESSION: No active cardiopulmonary disease. Electronically Signed   By: Rolm Baptise M.D.   On: 08/12/2018 21:44   Ct Angio Chest/abd/pel For Dissection W And/or Wo Contrast  Result Date: 08/13/2018 CLINICAL DATA:  68 year old female with chest and back pain. Concern for dissection. EXAM: CT ANGIOGRAPHY CHEST, ABDOMEN AND PELVIS TECHNIQUE: Multidetector CT imaging through the chest, abdomen and pelvis was performed using the standard protocol during bolus administration of intravenous  contrast. Multiplanar reconstructed images and MIPs were obtained and reviewed to evaluate the vascular anatomy. CONTRAST:  142mL ISOVUE-370 IOPAMIDOL (ISOVUE-370) INJECTION 76% COMPARISON:  Chest radiograph dated 08/12/2018 FINDINGS: CTA CHEST FINDINGS Cardiovascular: Borderline cardiomegaly. No pericardial effusion. Minimal atherosclerotic calcification of the thoracic aorta. No aneurysmal dilatation or dissection. The origins of the great vessels of the aortic arch are patent. There is no CT evidence of pulmonary  embolism. Mediastinum/Nodes: No hilar or mediastinal adenopathy. The esophagus the thyroid gland are grossly unremarkable. No mediastinal fluid collection. Lungs/Pleura: There are innumerable scattered pulmonary nodules measuring up to 7 mm predominantly in the mid to upper lung field. Most of nodules demonstrate central cavitation. Findings may represent a fungal infection, abscess, or TB. Other etiologies are not excluded. Clinical correlation and follow-up recommended. No lobar consolidation, pleural effusion, or pneumothorax. The central airways are patent. Musculoskeletal: No chest wall abnormality. No acute or significant osseous findings. Review of the MIP images confirms the above findings. CTA ABDOMEN AND PELVIS FINDINGS VASCULAR Aorta: Mild atherosclerotic disease. No aneurysmal dilatation or dissection. 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 Celiac: Patent without evidence of aneurysm, dissection, vasculitis or significant stenosis. SMA: The SMA is patent. 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 Renals: Both renal arteries are patent without evidence of aneurysm, dissection, vasculitis, fibromuscular dysplasia or significant stenosis. IMA: Patent without evidence of aneurysm, dissection, vasculitis or significant stenosis. Inflow: Patent without evidence of aneurysm, dissection, vasculitis or significant stenosis. Veins: No obvious  venous abnormality within the limitations of this arterial phase study. Review of the MIP images confirms the above findings. NON-VASCULAR No intra-abdominal free air or free fluid. Hepatobiliary: There is a 12 mm hypodense lesion in the right lobe of the liver inferiorly (series 9, image 142) which demonstrates a somewhat ill-defined margins. This is not characterized. An additional faint subcentimeter hypodense lesion in the inferior right lobe of the liver noted. Further characterization with MRI on a nonemergent basis recommended. No intrahepatic biliary ductal dilatation. The gallbladder is unremarkable. Pancreas: Unremarkable. No pancreatic ductal dilatation or surrounding inflammatory changes. Spleen: Normal in size without focal abnormality. Adrenals/Urinary Tract: Mild thickening of the lateral limb of the left adrenal gland measuring up to 10 mm, possibly hyperplasia or adenoma. The right adrenal gland is unremarkable. There is no hydronephrosis on either side. There is an 11 mm nonobstructing left renal interpolar calculus. Additional punctate nonobstructing stone in the inferior pole of the left kidney. The visualized ureters and urinary bladder appear unremarkable. Stomach/Bowel: There is no bowel obstruction or active inflammation. Normal appendix. Lymphatic: No adenopathy. Reproductive: Hysterectomy. No pelvic mass. Other: Small fat containing umbilical hernia. Musculoskeletal: No acute osseous pathology. Review of the MIP images confirms the above findings. IMPRESSION: 1. No aortic dissection or aneurysm. No CT evidence of pulmonary embolism. 2. Innumerable pulmonary nodules with central cavitation. Findings may represent a fungal infection, abscess, or TB. Other etiologies are not excluded. Clinical correlation and follow-up recommended. 3. 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 4. Indeterminate liver lesions. Further characterization with MRI on a nonemergent basis recommended. 5. Nonobstructing left  renal calculi. No hydronephrosis. Electronically Signed   By: Anner Crete M.D.   On: 08/13/2018 01:27   US Abdomen Limited Ruq  Result Date: 08/12/2018 CLINICAL DATA:  Initial evaluation for acute abdominal pain, right upper quadrant. EXAM: ULTRASOUND ABDOMEN LIMITED RIGHT UPPER QUADRANT COMPARISON:  None. FINDINGS: Gallbladder: No gallstones or wall thickening visualized. No sonographic Murphy sign noted by sonographer. Common bile duct: Diameter: 3.5 mm Liver: No focal lesion identified. Within normal limits in parenchymal echogenicity. Portal vein is patent on color Doppler imaging with normal direction of blood flow towards the liver. IMPRESSION: Normal right upper quadrant ultrasound. Electronically Signed   By: Jeannine Boga M.D.   On: 08/12/2018 23:35    EKG: Independently reviewed. Sinus rhythm, non-specific T-wave abnormality  in anterolateral leads.   Assessment/Plan   1. Cavitary lung nodules   - Presents with pain about the right lower chest and RUQ abdomen and is found to have innumerable pulmonary nodules with central cavitation  - She is afebrile, there is no leukocytosis, and she denies cough, SOB, or fever/chills at home  - Blood cultures and Quantiferon collected in ED and she was treated with empiric antibiotics  - Continue airborne precautions, check sputum for AFB, continue empiric antibiotic for possible bacterial etiology   2. Hypokalemia  - Serum potassium is 3.2 on admission, replaced in ED  - Repeat chem panel in am   3. Depression  - Continue Lexapro and trazodone    4. Hypertension  - BP at goal  - Continue HCTZ and lisinopril as tolerated    5. Liver lesions  - Noted incidentally on CT in ED  - Non-emergent MRI recommended    6. Right sided chest and upper abdominal pain  - Extensive ED workup without clear etiology, likely MSK strain, possibly related to liver and/or pulmonary lesions though not pleuritic    DVT prophylaxis: Lovenox  Code  Status: Full  Family Communication: Discussed with patient   Consults called: None Admission status: Observation     Vianne Bulls, MD [facility] Pager [phone]  If 7PM-7AM, please contact night-coverage [email or web address] Password [facility]  08/13/2018, 2:27 AM

## 2018-08-13 NOTE — ED Provider Notes (Signed)
Care assumed from Dr. Regenia Skeeter.  Patient presents with right-sided rib, chest and upper abdominal pain.  EKG reassuring.  Troponin negative.  LFTs and lipase normal.  Right Upper quadrant ultrasound negative.  CT scan pending as well as second troponin.  Second troponin negative.  CT scan shows no evidence of aortic dissection or pulmonary embolism.  Does show innumerable pulmonary nodules with cavitation.  Patient denies any fever, shortness of breath, cough.  She denies any history of immunosuppression. or recent travel.  Unclear whether this is contributing to her pain.  Her right-sided rib pain appears to be musculoskeletal.  Consider herpes zoster with rash not yet visible.  Unclear etiology of patient's pulmonary nodules with cavitation.  She does not appear to have any TB risk factors or recent infectious symptoms.  Patient will be placed in airborne precautions and will start broad-spectrum antibiotics for possibility of lung abscess. She will likely need pulmonary and infectious disease consultations. Observation admission discussed with Dr. Myna Hidalgo.   Ezequiel Essex, MD 08/13/18 0300

## 2018-08-14 DIAGNOSIS — Z716 Tobacco abuse counseling: Secondary | ICD-10-CM | POA: Diagnosis not present

## 2018-08-14 DIAGNOSIS — F329 Major depressive disorder, single episode, unspecified: Secondary | ICD-10-CM | POA: Diagnosis present

## 2018-08-14 DIAGNOSIS — F17210 Nicotine dependence, cigarettes, uncomplicated: Secondary | ICD-10-CM | POA: Diagnosis present

## 2018-08-14 DIAGNOSIS — E739 Lactose intolerance, unspecified: Secondary | ICD-10-CM | POA: Diagnosis present

## 2018-08-14 DIAGNOSIS — Z7982 Long term (current) use of aspirin: Secondary | ICD-10-CM | POA: Diagnosis not present

## 2018-08-14 DIAGNOSIS — Z8249 Family history of ischemic heart disease and other diseases of the circulatory system: Secondary | ICD-10-CM | POA: Diagnosis not present

## 2018-08-14 DIAGNOSIS — K769 Liver disease, unspecified: Secondary | ICD-10-CM | POA: Diagnosis present

## 2018-08-14 DIAGNOSIS — Z833 Family history of diabetes mellitus: Secondary | ICD-10-CM | POA: Diagnosis not present

## 2018-08-14 DIAGNOSIS — R918 Other nonspecific abnormal finding of lung field: Secondary | ICD-10-CM | POA: Diagnosis not present

## 2018-08-14 DIAGNOSIS — Z880 Allergy status to penicillin: Secondary | ICD-10-CM | POA: Diagnosis not present

## 2018-08-14 DIAGNOSIS — I77811 Abdominal aortic ectasia: Secondary | ICD-10-CM | POA: Diagnosis present

## 2018-08-14 DIAGNOSIS — Z90722 Acquired absence of ovaries, bilateral: Secondary | ICD-10-CM | POA: Diagnosis not present

## 2018-08-14 DIAGNOSIS — R0789 Other chest pain: Secondary | ICD-10-CM | POA: Diagnosis present

## 2018-08-14 DIAGNOSIS — R1011 Right upper quadrant pain: Secondary | ICD-10-CM | POA: Diagnosis not present

## 2018-08-14 DIAGNOSIS — J984 Other disorders of lung: Secondary | ICD-10-CM | POA: Diagnosis not present

## 2018-08-14 DIAGNOSIS — Z79891 Long term (current) use of opiate analgesic: Secondary | ICD-10-CM | POA: Diagnosis not present

## 2018-08-14 DIAGNOSIS — Z885 Allergy status to narcotic agent status: Secondary | ICD-10-CM | POA: Diagnosis not present

## 2018-08-14 DIAGNOSIS — M797 Fibromyalgia: Secondary | ICD-10-CM | POA: Diagnosis present

## 2018-08-14 DIAGNOSIS — Z79899 Other long term (current) drug therapy: Secondary | ICD-10-CM | POA: Diagnosis not present

## 2018-08-14 DIAGNOSIS — J449 Chronic obstructive pulmonary disease, unspecified: Secondary | ICD-10-CM | POA: Diagnosis present

## 2018-08-14 DIAGNOSIS — Z9079 Acquired absence of other genital organ(s): Secondary | ICD-10-CM | POA: Diagnosis not present

## 2018-08-14 DIAGNOSIS — E119 Type 2 diabetes mellitus without complications: Secondary | ICD-10-CM | POA: Diagnosis present

## 2018-08-14 DIAGNOSIS — I10 Essential (primary) hypertension: Secondary | ICD-10-CM | POA: Diagnosis not present

## 2018-08-14 DIAGNOSIS — E876 Hypokalemia: Secondary | ICD-10-CM | POA: Diagnosis present

## 2018-08-14 DIAGNOSIS — Z90710 Acquired absence of both cervix and uterus: Secondary | ICD-10-CM | POA: Diagnosis not present

## 2018-08-14 LAB — ANGIOTENSIN CONVERTING ENZYME: Angiotensin-Converting Enzyme: 5 U/L — ABNORMAL LOW (ref 14–82)

## 2018-08-14 LAB — MPO/PR-3 (ANCA) ANTIBODIES
ANCA Proteinase 3: <3.5 U/mL (ref 0.0–3.5)
Myeloperoxidase Abs: <9 U/mL (ref 0.0–9.0)

## 2018-08-14 LAB — ANTINUCLEAR ANTIBODIES, IFA: ANA Ab, IFA: NEGATIVE

## 2018-08-14 LAB — ANCA TITERS: Atypical P-ANCA titer: <1:20 {titer}

## 2018-08-14 LAB — PROCALCITONIN: Procalcitonin: <0.1 ng/mL

## 2018-08-14 LAB — SJOGRENS SYNDROME-B EXTRACTABLE NUCLEAR ANTIBODY: SSB (La) (ENA) Antibody, IgG: <0.2 AI (ref 0.0–0.9)

## 2018-08-14 LAB — ANTI-SCLERODERMA ANTIBODY: Scleroderma (Scl-70) (ENA) Antibody, IgG: <0.2 AI (ref 0.0–0.9)

## 2018-08-14 LAB — SJOGRENS SYNDROME-A EXTRACTABLE NUCLEAR ANTIBODY: SSA (Ro) (ENA) Antibody, IgG: <0.2 AI (ref 0.0–0.9)

## 2018-08-14 MED ORDER — METHOCARBAMOL 500 MG PO TABS
500.0000 mg | ORAL_TABLET | Freq: Four times a day (QID) | ORAL | Status: DC | PRN
  Administered 2018-08-14: 500 mg via ORAL
  Filled 2018-08-14: qty 1

## 2018-08-14 MED ORDER — LIDOCAINE 5 % EX PTCH
1.0000 | MEDICATED_PATCH | CUTANEOUS | Status: DC
  Administered 2018-08-14 – 2018-08-15 (×2): 1 via TRANSDERMAL
  Filled 2018-08-14 (×2): qty 1

## 2018-08-14 NOTE — Progress Notes (Signed)
Progress Note    Sara Powell  IOX:[id number] DOB: 07-05-1950  DOA: 08/12/2018 PCP: Buzzy Han, MD    Brief Narrative:     Medical records reviewed and are as summarized below: Sara Powell is a 68 y.o. female with medical history significant for depression and hypertension, now presenting to the emergency department for evaluation of pain in the chest and upper abdomen.  She reports been in her usual state of health until the acute onset of pain in the right lower chest and right upper quadrant abdomen this afternoon.  Assessment/Plan:   Principal Problem:   Cavitary lung disease Active Problems:   Depression   Hypertension   Hypokalemia  Cavitary lung nodules   - found to have innumerable pulmonary nodules with central cavitation  - She is afebrile, there is no leukocytosis, and she denies cough, SOB, or fever/chills at home  - Blood cultures and Quantiferon collected in ED  -seen by PCCM -differential: sarcoidoses (multiple labs pending and follow up with Dr. Valeta Harms arranged), TB (very unlikely-- Quantiferon negative-- not on isolation-- discussed with ID) and infection-- does have poor dentition and is due to see a dentist for a tooth extraction  Hypokalemia  - replaced  Depression  - Continue Lexapro and trazodone    Hypertension  - BP at goal  - Continue HCTZ and lisinopril as tolerated    Liver lesions  - Noted incidentally on CT in ED  - Non-emergent MRI recommended -- outpatient follow up   Right sided chest and upper abdominal pain  - Extensive ED workup without clear etiology, likely MSK strain -try lidocaine patch -currently requiring IV Pain medications (only on tramadol at home BID)  Tobacco abuse -encourage cessation   Family Communication/Anticipated D/C date and plan/Code Status   DVT prophylaxis: Lovenox ordered. Code Status: Full Code.  Family Communication: patient Disposition Plan: pending labs and pain  control   Medical Consultants:    PCCM  Subjective:   C/o right sided pain  Objective:    Vitals:   08/13/18 2139 08/14/18 0327 08/14/18 0804 08/14/18 0824  BP: (!) 174/92 140/79 (!) 156/82 128/61  Pulse: 61 (!) 59 63 87  Resp: (!) 21 (!) 21 20 18   Temp: 98.4 F (36.9 C) 98.3 F (36.8 C) 98.3 F (36.8 C) 98.3 F (36.8 C)  TempSrc: Oral Oral Oral Oral  SpO2: 96% 91% 93% 100%  Weight: 81 kg       Intake/Output Summary (Last 24 hours) at 08/14/2018 1508 Last data filed at 08/14/2018 1138 Gross per 24 hour  Intake 960 ml  Output 2375 ml  Net -1415 ml   Filed Weights   08/13/18 2139  Weight: 81 kg    Exam: In bed, NAD rrr Diminished, no wheezing No LE Edema No paraspinal tenderness  Data Reviewed:   I have personally reviewed following labs and imaging studies:  Labs: Labs show the following:   Basic Metabolic Panel: Recent Labs  Lab 08/12/18 2104 08/13/18 0332  NA 141 140  K 3.2* 3.7  CL 107 105  CO2 24 25  GLUCOSE 135* 173*  BUN 12 10  CREATININE 0.96 0.80  CALCIUM 9.0 8.8*  MG  --  2.1   GFR CrCl cannot be calculated (Unknown ideal weight.). Liver Function Tests: Recent Labs  Lab 08/12/18 2104  AST 15  ALT 14  ALKPHOS 89  BILITOT 0.6  PROT 7.1  ALBUMIN 3.9   Recent Labs  Lab 08/12/18 2104  LIPASE 34   No results for input(s): AMMONIA in the last 168 hours. Coagulation profile No results for input(s): INR, PROTIME in the last 168 hours.  CBC: Recent Labs  Lab 08/12/18 2104 08/13/18 0332  WBC 7.2 6.8  NEUTROABS  --  4.1  HGB 14.3 13.6  HCT 44.7 42.9  MCV 92.0 92.7  PLT 280 265   Cardiac Enzymes: No results for input(s): CKTOTAL, CKMB, CKMBINDEX, TROPONINI in the last 168 hours. BNP (last 3 results) No results for input(s): PROBNP in the last 8760 hours. CBG: No results for input(s): GLUCAP in the last 168 hours. D-Dimer: No results for input(s): DDIMER in the last 72 hours. Hgb A1c: No results for input(s):  HGBA1C in the last 72 hours. Lipid Profile: No results for input(s): CHOL, HDL, LDLCALC, TRIG, CHOLHDL, LDLDIRECT in the last 72 hours. Thyroid function studies: No results for input(s): TSH, T4TOTAL, T3FREE, THYROIDAB in the last 72 hours.  Invalid input(s): FREET3 Anemia work up: No results for input(s): VITAMINB12, FOLATE, FERRITIN, TIBC, IRON, RETICCTPCT in the last 72 hours. Sepsis Labs: Recent Labs  Lab 08/12/18 2104 08/13/18 0205 08/13/18 0332 08/13/18 0834 08/14/18 0356  PROCALCITON  --   --  <0.10  --  <0.10  WBC 7.2  --  6.8  --   --   LATICACIDVEN  --  1.2 2.1* 0.9  --     Microbiology Recent Results (from the past 240 hour(s))  Blood culture (routine x 2)     Status: None (Preliminary result)   Collection Time: 08/13/18  2:18 AM  Result Value Ref Range Status   Specimen Description BLOOD LEFT ANTECUBITAL  Final   Special Requests   Final    BOTTLES DRAWN AEROBIC AND ANAEROBIC Blood Culture adequate volume   Culture   Final    NO GROWTH 1 DAY Performed at [facility], [place] [address], [place], [place] [address]    Report Status PENDING  Incomplete  Blood culture (routine x 2)     Status: None (Preliminary result)   Collection Time: 08/13/18  2:29 AM  Result Value Ref Range Status   Specimen Description BLOOD RIGHT UPPER ARM  Final   Special Requests   Final    BOTTLES DRAWN AEROBIC AND ANAEROBIC Blood Culture adequate volume   Culture   Final    NO GROWTH 1 DAY Performed at [facility], [place] [address], [place], [place] [address]    Report Status PENDING  Incomplete    Procedures and diagnostic studies:  Dg Chest 2 View  Result Date: 08/12/2018 CLINICAL DATA:  Chest pain EXAM: CHEST - 2 VIEW COMPARISON:  None. FINDINGS: Heart and mediastinal contours are within normal limits. No focal opacities or effusions. No acute bony abnormality. IMPRESSION: No active cardiopulmonary disease. Electronically Signed   By: Rolm Baptise M.D.   On: 08/12/2018  21:44   Ct Angio Chest/abd/pel For Dissection W And/or Wo Contrast  Result Date: 08/13/2018 CLINICAL DATA:  68 year old female with chest and back pain. Concern for dissection. EXAM: CT ANGIOGRAPHY CHEST, ABDOMEN AND PELVIS TECHNIQUE: Multidetector CT imaging through the chest, abdomen and pelvis was performed using the standard protocol during bolus administration of intravenous contrast. Multiplanar reconstructed images and MIPs were obtained and reviewed to evaluate the vascular anatomy. CONTRAST:  111mL ISOVUE-370 IOPAMIDOL (ISOVUE-370) INJECTION 76% COMPARISON:  Chest radiograph dated 08/12/2018 FINDINGS: CTA CHEST FINDINGS Cardiovascular: Borderline cardiomegaly. No pericardial effusion. Minimal atherosclerotic calcification of the thoracic aorta. No aneurysmal  dilatation or dissection. The origins of the great vessels of the aortic arch are patent. There is no CT evidence of pulmonary embolism. Mediastinum/Nodes: No hilar or mediastinal adenopathy. The esophagus the thyroid gland are grossly unremarkable. No mediastinal fluid collection. Lungs/Pleura: There are innumerable scattered pulmonary nodules measuring up to 7 mm predominantly in the mid to upper lung field. Most of nodules demonstrate central cavitation. Findings may represent a fungal infection, abscess, or TB. Other etiologies are not excluded. Clinical correlation and follow-up recommended. No lobar consolidation, pleural effusion, or pneumothorax. The central airways are patent. Musculoskeletal: No chest wall abnormality. No acute or significant osseous findings. Review of the MIP images confirms the above findings. CTA ABDOMEN AND PELVIS FINDINGS VASCULAR Aorta: Mild atherosclerotic disease. No aneurysmal dilatation or dissection. 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 Celiac: Patent without evidence of aneurysm, dissection, vasculitis or significant stenosis. SMA: The SMA is patent. 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 Renals: Both renal arteries are patent without evidence of aneurysm, dissection, vasculitis, fibromuscular dysplasia or significant stenosis. IMA: Patent without evidence of aneurysm, dissection, vasculitis or significant stenosis. Inflow: Patent without evidence of aneurysm, dissection, vasculitis or significant stenosis. Veins: No obvious venous abnormality within the limitations of this arterial phase study. Review of the MIP images confirms the above findings. NON-VASCULAR No intra-abdominal free air or free fluid. Hepatobiliary: There is a 12 mm hypodense lesion in the right lobe of the liver inferiorly (series 9, image 142) which demonstrates a somewhat ill-defined margins. This is not characterized. An additional faint subcentimeter hypodense lesion in the inferior right lobe of the liver noted. Further characterization with MRI on a nonemergent basis recommended. No intrahepatic biliary ductal dilatation. The gallbladder is unremarkable. Pancreas: Unremarkable. No pancreatic ductal dilatation or surrounding inflammatory changes. Spleen: Normal in size without focal abnormality. Adrenals/Urinary Tract: Mild thickening of the lateral limb of the left adrenal gland measuring up to 10 mm, possibly hyperplasia or adenoma. The right adrenal gland is unremarkable. There is no hydronephrosis on either side. There is an 11 mm nonobstructing left renal interpolar calculus. Additional punctate nonobstructing stone in the inferior pole of the left kidney. The visualized ureters and urinary bladder appear unremarkable. Stomach/Bowel: There is no bowel obstruction or active inflammation. Normal appendix. Lymphatic: No adenopathy. Reproductive: Hysterectomy. No pelvic mass. Other: Small fat containing umbilical hernia. Musculoskeletal: No acute osseous pathology. Review of the MIP images confirms the above findings. IMPRESSION: 1. No aortic dissection or aneurysm. No CT evidence  of pulmonary embolism. 2. Innumerable pulmonary nodules with central cavitation. Findings may represent a fungal infection, abscess, or TB. Other etiologies are not excluded. Clinical correlation and follow-up recommended. 3. 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 4. Indeterminate liver lesions. Further characterization with MRI on a nonemergent basis recommended. 5. Nonobstructing left renal calculi. No hydronephrosis. Electronically Signed   By: Anner Crete M.D.   On: 08/13/2018 01:27   US Abdomen Limited Ruq  Result Date: 08/12/2018 CLINICAL DATA:  Initial evaluation for acute abdominal pain, right upper quadrant. EXAM: ULTRASOUND ABDOMEN LIMITED RIGHT UPPER QUADRANT COMPARISON:  None. FINDINGS: Gallbladder: No gallstones or wall thickening visualized. No sonographic Murphy sign noted by sonographer. Common bile duct: Diameter: 3.5 mm Liver: No focal lesion identified. Within normal limits in parenchymal echogenicity. Portal vein is patent on color Doppler imaging with normal direction of blood flow towards the liver. IMPRESSION: Normal right upper quadrant ultrasound. Electronically Signed  By: Jeannine Boga M.D.   On: 08/12/2018 23:35    Medications:   . aspirin  81 mg Oral Daily  . doxycycline  100 mg Oral Q12H  . enoxaparin (LOVENOX) injection  40 mg Subcutaneous Q24H  . escitalopram  10 mg Oral Daily  . hydrochlorothiazide  25 mg Oral Daily  . latanoprost  1 drop Both Eyes QHS  . lidocaine  1 patch Transdermal Q24H  . lisinopril  20 mg Oral Daily  . pravastatin  40 mg Oral q1800  . sodium chloride flush  3 mL Intravenous Q12H  . tiZANidine  4 mg Oral Daily  . traZODone  50 mg Oral QHS   Continuous Infusions: . sodium chloride       LOS: 0 days   Geradine Girt  [facility]   How to contact the [facility] Attending or Consulting provider [place] or covering provider during after hours [place], for this patient?  1. Check the care team in [facility] and look for a)  attending/consulting [facility] provider listed and b) the [facility] team listed 2. Log into [email or web address] and use [place]'s universal password to access. If you do not have the password, please contact the hospital operator. 3. Locate the [facility] provider you are looking for under [facility] and page to a number that you can be directly reached. 4. If you still have difficulty reaching the provider, please page the [facility] (Director on Call) for the Hospitalists listed on amion for assistance.  08/14/2018, 3:08 PM

## 2018-08-14 NOTE — Care Management Obs Status (Signed)
[facility] NOTIFICATION   Patient Details  Name: Sara Powell MRN: [id number] Date of Birth: 06-Feb-1951   Medicare Observation Status Notification Given:  Yes    Bartholomew Crews, RN 08/14/2018, 10:22 AM

## 2018-08-14 NOTE — Care Management Note (Signed)
Case Management Note Manya Silvas, RN MSN CCM Transitions of Care [address] [place] [phone]  Patient Details  Name: Sara Powell MRN: [id number] Date of Birth: [birth year]  Subjective/Objective:        Cavitary lung disease          Action/Plan: PTA home. Spoke with patient at bedside. Patient is usually the caregiver. Independent. Has PCP at [facility], [facility]. No problems with transportation. No HH/DME needs. Uses [facility] for prescriptions with [facility] for backup. Discussed goal of smoking cessation. Advised of contact number on AVS. Patient stated that she has signed up with [facility] and can get the patches. Provided support toward goals. Discussed having a plan for stress-patient not sure, but she knows she wants to be around for her great grandchildren. No other transition of care needs identified at this time.   Expected Discharge Date:                  Expected Discharge Plan:  Home/Self Care  In-House Referral:  NA  Discharge planning Services  CM Consult  Post Acute Care Choice:  NA Choice offered to:  NA  DME Arranged:  N/A DME Agency:  NA  HH Arranged:  NA HH Agency:  NA  Status of Service:  Completed, signed off  If discussed at [place] of Stay Meetings, dates discussed:    Additional Comments:  Bartholomew Crews, RN 08/14/2018, 10:25 AM

## 2018-08-15 DIAGNOSIS — E119 Type 2 diabetes mellitus without complications: Secondary | ICD-10-CM

## 2018-08-15 LAB — BASIC METABOLIC PANEL
ANION GAP: 10 (ref 5–15)
BUN: 14 mg/dL (ref 8–23)
CO2: 25 mmol/L (ref 22–32)
Calcium: 8.9 mg/dL (ref 8.9–10.3)
Chloride: 106 mmol/L (ref 98–111)
Creatinine, Ser: 0.85 mg/dL (ref 0.44–1.00)
GFR calc non Af Amer: >60 mL/min (ref >60)
Glucose, Bld: 113 mg/dL — ABNORMAL HIGH (ref 70–99)
Potassium: 3.6 mmol/L (ref 3.5–5.1)
Sodium: 141 mmol/L (ref 135–145)

## 2018-08-15 LAB — GLUCOSE, CAPILLARY
Glucose-Capillary: 103 mg/dL — ABNORMAL HIGH (ref 70–99)
Glucose-Capillary: 84 mg/dL (ref 70–99)
Glucose-Capillary: 86 mg/dL (ref 70–99)
Glucose-Capillary: 84 mg/dL (ref 70–99)

## 2018-08-15 LAB — QUANTIFERON-TB GOLD PLUS (RQFGPL)
QuantiFERON Mitogen Value: 6.17 IU/mL
QuantiFERON Nil Value: 0.03 IU/mL
QuantiFERON TB1 Ag Value: 0.03 IU/mL
QuantiFERON TB2 Ag Value: 0.03 IU/mL

## 2018-08-15 LAB — CBC
HCT: 42.5 % (ref 36.0–46.0)
Hemoglobin: 13.8 g/dL (ref 12.0–15.0)
MCH: 28.9 pg (ref 26.0–34.0)
MCHC: 32.5 g/dL (ref 30.0–36.0)
MCV: 89.1 fL (ref 80.0–100.0)
Platelets: 221 10*3/uL (ref 150–400)
RBC: 4.77 MIL/uL (ref 3.87–5.11)
RDW: 12.7 % (ref 11.5–15.5)
WBC: 6.3 10*3/uL (ref 4.0–10.5)
nRBC: 0 % (ref 0.0–0.2)

## 2018-08-15 LAB — HEMOGLOBIN A1C
Hgb A1c MFr Bld: 7.1 % — ABNORMAL HIGH (ref 4.8–5.6)
Mean Plasma Glucose: 157.07 mg/dL

## 2018-08-15 LAB — QUANTIFERON-TB GOLD PLUS: QuantiFERON-TB Gold Plus: NEGATIVE

## 2018-08-15 MED ORDER — INSULIN ASPART 100 UNIT/ML ~~LOC~~ SOLN
0.0000 [IU] | Freq: Three times a day (TID) | SUBCUTANEOUS | Status: DC
  Administered 2018-08-16: 3 [IU] via SUBCUTANEOUS

## 2018-08-15 MED ORDER — INSULIN ASPART 100 UNIT/ML ~~LOC~~ SOLN
3.0000 [IU] | Freq: Three times a day (TID) | SUBCUTANEOUS | Status: DC
  Administered 2018-08-15 – 2018-08-16 (×4): 3 [IU] via SUBCUTANEOUS

## 2018-08-15 MED ORDER — INSULIN ASPART 100 UNIT/ML ~~LOC~~ SOLN: 0.0000 [IU] | Freq: Every day | SUBCUTANEOUS | Status: DC

## 2018-08-15 NOTE — Progress Notes (Signed)
[facility] PROGRESS NOTE    Progress Note  Sara Powell  GYK:[id number] DOB: Jul 16, 1950 DOA: 08/12/2018 PCP: Buzzy Han, MD     Brief Narrative:   Sara Powell is an 68 y.o. female past medical history significant for depression, hypertension presents to the emergency room with chest  and upper abdominal pain.   Assessment/Plan:   Cavitary lung disease: CT chest done on 08/13/2018, showed pulmonary nodules with central cavitation Denies any fever she has no leukocytosis denies any cough shortness of breath. Blood cultures negative till date Serum QuantiFERON pending HIV negative, procalcitonin less than 0.1 sed rate 25, A1c 7.1, ACE test 5 Was consulted recommended 7 days of antibiotics.,  Currently on doxycycline. Autoimmune panel pending.  Need to follow-up with PCCM as an outpatient. Awaiting PCCM recommendations.  hypoKalemia: Repleted orally now improved.  Depression: Continue Lexapro and trazodone.  Essential hypertension: Continue ACE inhibitor and hydrochlorothiazide.  Liver lesion: Incidental finding on CT follow-up as an outpatient.  Right-sided chest pain and upper abdominal pain: Work-up done in the ED without clear etiology likely musculoskeletal.  Newly diagnosed diabetes mellitus type 2: With an A1c of 7.1, had a long discussion with the patient about starting medication, she relates she would like to try diet and exercise first. She will follow-up with her primary care doctor and A1c will need to be repeated in 3 months.  DVT prophylaxis: lovenox Family Communication:none Disposition Plan/Barrier to D/C: home in am Code Status:     Code Status Orders  (From admission, onward)         Start     Ordered   08/13/18 0300  Full code  Continuous     08/13/18 0259        Code Status History    This patient has a current code status but no historical code status.        IV Access:    Peripheral  IV   Procedures and diagnostic studies:   No results found.   Medical Consultants:    None.  Anti-Infectives:   Doxycycline  Subjective:    Sara Powell that she feels great.  Objective:    Vitals:   08/14/18 0804 08/14/18 0824 08/14/18 2103 08/15/18 0422  BP: (!) 156/82 128/61 (!) 162/89 131/77  Pulse: 63 87 63 63  Resp: 20 18 20 20   Temp: 98.3 F (36.8 C) 98.3 F (36.8 C) 98.5 F (36.9 C) 98.1 F (36.7 C)  TempSrc: Oral Oral Oral Oral  SpO2: 93% 100% 90% 92%  Weight:        Intake/Output Summary (Last 24 hours) at 08/15/2018 0729 Last data filed at 08/15/2018 0600 Gross per 24 hour  Intake 720 ml  Output 1550 ml  Net -830 ml   Filed Weights   08/13/18 2139  Weight: 81 kg    Exam: General exam: In no acute distress. Respiratory system: Good air movement and clear to auscultation. Cardiovascular system: S1 & S2 heard, RRR.  Gastrointestinal system: Abdomen is nondistended, soft and nontender.  Central nervous system: Alert and oriented. No focal neurological deficits. Extremities: No pedal edema. Skin: No rashes, lesions or ulcers Psychiatry: Judgement and insight appear normal. Mood & affect appropriate.    Data Reviewed:    Labs: Basic Metabolic Panel: Recent Labs  Lab 08/12/18 2104 08/13/18 0332 08/15/18 0431  NA 141 140 141  K 3.2* 3.7 3.6  CL 107 105 106  CO2 24 25 25   GLUCOSE 135* 173* 113*  BUN 12 10 14   CREATININE 0.96 0.80 0.85  CALCIUM 9.0 8.8* 8.9  MG  --  2.1  --    GFR CrCl cannot be calculated (Unknown ideal weight.). Liver Function Tests: Recent Labs  Lab 08/12/18 2104  AST 15  ALT 14  ALKPHOS 89  BILITOT 0.6  PROT 7.1  ALBUMIN 3.9   Recent Labs  Lab 08/12/18 2104  LIPASE 34   No results for input(s): AMMONIA in the last 168 hours. Coagulation profile No results for input(s): INR, PROTIME in the last 168 hours.  CBC: Recent Labs  Lab 08/12/18 2104 08/13/18 0332 08/15/18 0431  WBC 7.2 6.8  6.3  NEUTROABS  --  4.1  --   HGB 14.3 13.6 13.8  HCT 44.7 42.9 42.5  MCV 92.0 92.7 89.1  PLT 280 265 221   Cardiac Enzymes: No results for input(s): CKTOTAL, CKMB, CKMBINDEX, TROPONINI in the last 168 hours. BNP (last 3 results) No results for input(s): PROBNP in the last 8760 hours. CBG: No results for input(s): GLUCAP in the last 168 hours. D-Dimer: No results for input(s): DDIMER in the last 72 hours. Hgb A1c: Recent Labs    08/15/18 0431  HGBA1C 7.1*   Lipid Profile: No results for input(s): CHOL, HDL, LDLCALC, TRIG, CHOLHDL, LDLDIRECT in the last 72 hours. Thyroid function studies: No results for input(s): TSH, T4TOTAL, T3FREE, THYROIDAB in the last 72 hours.  Invalid input(s): FREET3 Anemia work up: No results for input(s): VITAMINB12, FOLATE, FERRITIN, TIBC, IRON, RETICCTPCT in the last 72 hours. Sepsis Labs: Recent Labs  Lab 08/12/18 2104 08/13/18 0205 08/13/18 0332 08/13/18 0834 08/14/18 0356 08/15/18 0431  PROCALCITON  --   --  <0.10  --  <0.10  --   WBC 7.2  --  6.8  --   --  6.3  LATICACIDVEN  --  1.2 2.1* 0.9  --   --    Microbiology Recent Results (from the past 240 hour(s))  Blood culture (routine x 2)     Status: None (Preliminary result)   Collection Time: 08/13/18  2:18 AM  Result Value Ref Range Status   Specimen Description BLOOD LEFT ANTECUBITAL  Final   Special Requests   Final    BOTTLES DRAWN AEROBIC AND ANAEROBIC Blood Culture adequate volume   Culture   Final    NO GROWTH 1 DAY Performed at [facility], [place] [address] [place], [place] [address]    Report Status PENDING  Incomplete  Blood culture (routine x 2)     Status: None (Preliminary result)   Collection Time: 08/13/18  2:29 AM  Result Value Ref Range Status   Specimen Description BLOOD RIGHT UPPER ARM  Final   Special Requests   Final    BOTTLES DRAWN AEROBIC AND ANAEROBIC Blood Culture adequate volume   Culture   Final    NO GROWTH 1 DAY Performed at [facility], [place] [address], [place], [place] [address]    Report Status PENDING  Incomplete     Medications:   . aspirin  81 mg Oral Daily  . doxycycline  100 mg Oral Q12H  . enoxaparin (LOVENOX) injection  40 mg Subcutaneous Q24H  . escitalopram  10 mg Oral Daily  . hydrochlorothiazide  25 mg Oral Daily  . latanoprost  1 drop Both Eyes QHS  . lidocaine  1 patch Transdermal Q24H  . lisinopril  20 mg Oral Daily  . pravastatin  40 mg Oral q1800  .  sodium chloride flush  3 mL Intravenous Q12H  . tiZANidine  4 mg Oral Daily  . traZODone  50 mg Oral QHS   Continuous Infusions: . sodium chloride       LOS: 1 day   Charlynne Cousins  [facility]  08/15/2018, 7:29 AM

## 2018-08-16 LAB — GLUCOSE, CAPILLARY
GLUCOSE-CAPILLARY: 77 mg/dL (ref 70–99)
Glucose-Capillary: 168 mg/dL — ABNORMAL HIGH (ref 70–99)

## 2018-08-16 MED ORDER — DOXYCYCLINE HYCLATE 100 MG PO TABS: 100.0000 mg | ORAL_TABLET | Freq: Two times a day (BID) | ORAL | 0 refills | Status: AC

## 2018-08-16 MED ORDER — AMOXICILLIN-POT CLAVULANATE 875-125 MG PO TABS: 1.0000 | ORAL_TABLET | Freq: Two times a day (BID) | ORAL | 0 refills | Status: DC

## 2018-08-16 NOTE — Consult Note (Signed)
            [facility] CM Primary Care Navigator  08/16/2018  Sara Powell 20-Jan-1951 [id number]   Went to see patient at the bedsideto identify possible discharge needsbut she was already discharge home per RN report.   Per MD note, patient presented to the ER with chest and upper abdominal pain. (cavitary lung disease)  Per Inpatient CM note, patient's primarycare provider is Dr. Buzzy Han with [facility] who is not a [facility] provider and not an affiliate of [facility].  Noted no [facility] care management needs identifiableat this point.   For additional questions please contact:  Edwena Felty A. Blu Lori, BSN, RN-BC [facility] PRIMARY CARE Navigator Cell: [phone]

## 2018-08-16 NOTE — Discharge Summary (Signed)
Physician Discharge Summary  RACHELLE EDWARDS YJE:[id number] DOB: 11/15/1950 DOA: 08/12/2018  PCP: Buzzy Han, MD  Admit date: 08/12/2018 Discharge date: 08/16/2018  Admitted From: Home Disposition:  Home  Recommendations for Outpatient Follow-up:  1. Follow up with Ulmonary in 4-6weeks 2.   Home Health:No Equipment/Devices:None  Discharge Condition:Stable CODE STATUS:Full Diet recommendation: Heart Healthy   Brief/Interim Summary: 68 y.o. female past medical history significant for depression, hypertension presents to the emergency room with chest  and upper abdominal pain.   Discharge Diagnoses:  Principal Problem:   Cavitary lung disease Active Problems:   Depression   Hypertension   Hypokalemia   Type 2 diabetes mellitus without complication (HCC)  Cavitary lung lesion: CT scan of the chest on 08/13/2018 show multiple pulmonary nodules with central cavitation, she denied a fever no leukocytosis she did have a a cough. Serum QuantiFERON is negative HIV is negative procalcitonin was less than 0.1 sed rate was 25 A1c was 5. Pulmonary was consulted who recommended 7 days of antibiotic follow-up autoimmune panel as an outpatient and will need further imaging as an outpatient with pulmonary in 4 to 6 weeks.  Hypokalemia: Repleted orally now improved.  Depression: No changes made to her medication.  Essential hypertension: No main changes made to her medication  Discharge Instructions   Allergies as of 08/16/2018      Reactions   Penicillins Hives, Itching   Did it involve swelling of the face/tongue/throat, SOB, or low BP? No Did it involve sudden or severe rash/hives, skin peeling, or any reaction on the inside of your mouth or nose? Yes Did you need to seek medical attention at a hospital or doctor's office? No When did it last happen?More than 10 years ago If all above answers are "NO", may proceed with cephalosporin use.   Tylox  [oxycodone-acetaminophen] Hives, Itching      Medication List    TAKE these medications   amoxicillin-clavulanate 875-125 MG tablet Commonly known as:  AUGMENTIN Take 1 tablet by mouth 2 (two) times daily for 14 days.   aspirin 81 MG tablet Take 81 mg by mouth daily.   B-COMPLEX PO Take by mouth daily.   diclofenac sodium 1 % Gel Commonly known as:  VOLTAREN Apply 4 g topically 3 (three) times daily as needed for pain.   doxycycline 100 MG tablet Commonly known as:  VIBRA-TABS Take 1 tablet (100 mg total) by mouth every 12 (twelve) hours for 5 days.   escitalopram 10 MG tablet Commonly known as:  LEXAPRO Take 10 mg by mouth daily.   FLUZONE HIGH-DOSE IM Inject into the muscle.   hydrochlorothiazide 25 MG tablet Commonly known as:  HYDRODIURIL Take 25 mg by mouth daily.   hydrocortisone 2.5 % cream Apply 1 application topically daily as needed for itching.   ketoconazole 2 % shampoo Commonly known as:  NIZORAL Apply 1 application topically daily as needed for rash.   latanoprost 0.005 % ophthalmic solution Commonly known as:  XALATAN Place 1 drop into both eyes at bedtime.   lisinopril 20 MG tablet Commonly known as:  PRINIVIL,ZESTRIL Take 20 mg by mouth daily.   multivitamin tablet Take 1 tablet by mouth daily.   pravastatin 40 MG tablet Commonly known as:  PRAVACHOL Take 40 mg by mouth daily.   PREVNAR 13 Susp injection Generic drug:  pneumococcal 13-valent conjugate vaccine Inject 0.5 mLs into the muscle once. To be administered by a pharmacist   tiZANidine 4 MG capsule Commonly known as:  ZANAFLEX Take 4 mg by mouth daily.   traMADol 50 MG tablet Commonly known as:  ULTRAM Take 50 mg by mouth daily as needed for moderate pain.   traZODone 50 MG tablet Commonly known as:  DESYREL Take 50 mg by mouth at bedtime.      Follow-up Information    Icard, Leory Plowman L, DO Follow up on 08/29/2018.   Specialty:  Pulmonary Disease Why:  Appt at 1:30  PM.  Please arrive at 1:15 PM for check in.  Contact information: [address] [place] [address] [phone]          Allergies  Allergen Reactions  . Penicillins Hives and Itching    Did it involve swelling of the face/tongue/throat, SOB, or low BP? No Did it involve sudden or severe rash/hives, skin peeling, or any reaction on the inside of your mouth or nose? Yes Did you need to seek medical attention at a hospital or doctor's office? No When did it last happen?More than 10 years ago If all above answers are "NO", may proceed with cephalosporin use.   Roselee Culver [Oxycodone-Acetaminophen] Hives and Itching    Consultations:  pulmonary   Procedures/Studies: Dg Chest 2 View  Result Date: 08/12/2018 CLINICAL DATA:  Chest pain EXAM: CHEST - 2 VIEW COMPARISON:  None. FINDINGS: Heart and mediastinal contours are within normal limits. No focal opacities or effusions. No acute bony abnormality. IMPRESSION: No active cardiopulmonary disease. Electronically Signed   By: Rolm Baptise M.D.   On: 08/12/2018 21:44   Ct Angio Chest/abd/pel For Dissection W And/or Wo Contrast  Result Date: 08/13/2018 CLINICAL DATA:  68 year old female with chest and back pain. Concern for dissection. EXAM: CT ANGIOGRAPHY CHEST, ABDOMEN AND PELVIS TECHNIQUE: Multidetector CT imaging through the chest, abdomen and pelvis was performed using the standard protocol during bolus administration of intravenous contrast. Multiplanar reconstructed images and MIPs were obtained and reviewed to evaluate the vascular anatomy. CONTRAST:  127mL ISOVUE-370 IOPAMIDOL (ISOVUE-370) INJECTION 76% COMPARISON:  Chest radiograph dated 08/12/2018 FINDINGS: CTA CHEST FINDINGS Cardiovascular: Borderline cardiomegaly. No pericardial effusion. Minimal atherosclerotic calcification of the thoracic aorta. No aneurysmal dilatation or dissection. The origins of the great vessels of the aortic arch are patent. There is no CT  evidence of pulmonary embolism. Mediastinum/Nodes: No hilar or mediastinal adenopathy. The esophagus the thyroid gland are grossly unremarkable. No mediastinal fluid collection. Lungs/Pleura: There are innumerable scattered pulmonary nodules measuring up to 7 mm predominantly in the mid to upper lung field. Most of nodules demonstrate central cavitation. Findings may represent a fungal infection, abscess, or TB. Other etiologies are not excluded. Clinical correlation and follow-up recommended. No lobar consolidation, pleural effusion, or pneumothorax. The central airways are patent. Musculoskeletal: No chest wall abnormality. No acute or significant osseous findings. Review of the MIP images confirms the above findings. CTA ABDOMEN AND PELVIS FINDINGS VASCULAR Aorta: Mild atherosclerotic disease. No aneurysmal dilatation or dissection. 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 Celiac: Patent without evidence of aneurysm, dissection, vasculitis or significant stenosis. SMA: The SMA is patent. 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 Renals: Both renal arteries are patent without evidence of aneurysm, dissection, vasculitis, fibromuscular dysplasia or significant stenosis. IMA: Patent without evidence of aneurysm, dissection, vasculitis or significant stenosis. Inflow: Patent without evidence of aneurysm, dissection, vasculitis or significant stenosis. Veins: No obvious venous abnormality within the limitations of this arterial phase study. Review of the MIP  images confirms the above findings. NON-VASCULAR No intra-abdominal free air or free fluid. Hepatobiliary: There is a 12 mm hypodense lesion in the right lobe of the liver inferiorly (series 9, image 142) which demonstrates a somewhat ill-defined margins. This is not characterized. An additional faint subcentimeter hypodense lesion in the inferior right lobe of the liver noted. Further characterization with  MRI on a nonemergent basis recommended. No intrahepatic biliary ductal dilatation. The gallbladder is unremarkable. Pancreas: Unremarkable. No pancreatic ductal dilatation or surrounding inflammatory changes. Spleen: Normal in size without focal abnormality. Adrenals/Urinary Tract: Mild thickening of the lateral limb of the left adrenal gland measuring up to 10 mm, possibly hyperplasia or adenoma. The right adrenal gland is unremarkable. There is no hydronephrosis on either side. There is an 11 mm nonobstructing left renal interpolar calculus. Additional punctate nonobstructing stone in the inferior pole of the left kidney. The visualized ureters and urinary bladder appear unremarkable. Stomach/Bowel: There is no bowel obstruction or active inflammation. Normal appendix. Lymphatic: No adenopathy. Reproductive: Hysterectomy. No pelvic mass. Other: Small fat containing umbilical hernia. Musculoskeletal: No acute osseous pathology. Review of the MIP images confirms the above findings. IMPRESSION: 1. No aortic dissection or aneurysm. No CT evidence of pulmonary embolism. 2. Innumerable pulmonary nodules with central cavitation. Findings may represent a fungal infection, abscess, or TB. Other etiologies are not excluded. Clinical correlation and follow-up recommended. 3. 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 4. Indeterminate liver lesions. Further characterization with MRI on a nonemergent basis recommended. 5. Nonobstructing left renal calculi. No hydronephrosis. Electronically Signed   By: Anner Crete M.D.   On: 08/13/2018 01:27   US Abdomen Limited Ruq  Result Date: 08/12/2018 CLINICAL DATA:  Initial evaluation for acute abdominal pain, right upper quadrant. EXAM: ULTRASOUND ABDOMEN LIMITED RIGHT UPPER QUADRANT COMPARISON:  None. FINDINGS: Gallbladder: No gallstones or wall thickening visualized. No sonographic Murphy sign noted by sonographer. Common bile duct: Diameter: 3.5 mm Liver: No focal lesion  identified. Within normal limits in parenchymal echogenicity. Portal vein is patent on color Doppler imaging with normal direction of blood flow towards the liver. IMPRESSION: Normal right upper quadrant ultrasound. Electronically Signed   By: Jeannine Boga M.D.   On: 08/12/2018 23:35     Subjective: No complaints feels great.  Discharge Exam: Vitals:   08/16/18 0609 08/16/18 0919  BP: 137/81 137/75  Pulse: 62 62  Resp: (!) 22   Temp: 98 F (36.7 C) 98.1 F (36.7 C)  SpO2: 100% 95%     General: Pt is alert, awake, not in acute distress Cardiovascular: RRR, S1/S2 +, no rubs, no gallops Respiratory: CTA bilaterally, no wheezing, no rhonchi Abdominal: Soft, NT, ND, bowel sounds + Extremities: no edema, no cyanosis    The results of significant diagnostics from this hospitalization (including imaging, microbiology, ancillary and laboratory) are listed below for reference.     Microbiology: Recent Results (from the past 240 hour(s))  Blood culture (routine x 2)     Status: None (Preliminary result)   Collection Time: 08/13/18  2:18 AM  Result Value Ref Range Status   Specimen Description BLOOD LEFT ANTECUBITAL  Final   Special Requests   Final    BOTTLES DRAWN AEROBIC AND ANAEROBIC Blood Culture adequate volume   Culture NO GROWTH 3 DAYS  Final   Report Status PENDING  Incomplete  Blood culture (routine x 2)     Status: None (Preliminary result)   Collection Time: 08/13/18  2:29 AM  Result Value Ref Range Status  Specimen Description BLOOD RIGHT UPPER ARM  Final   Special Requests   Final    BOTTLES DRAWN AEROBIC AND ANAEROBIC Blood Culture adequate volume   Culture NO GROWTH 3 DAYS  Final   Report Status PENDING  Incomplete     Labs: BNP (last 3 results) No results for input(s): BNP in the last 8760 hours. Basic Metabolic Panel: Recent Labs  Lab 08/12/18 2104 08/13/18 0332 08/15/18 0431  NA 141 140 141  K 3.2* 3.7 3.6  CL 107 105 106  CO2 24 25 25    GLUCOSE 135* 173* 113*  BUN 12 10 14   CREATININE 0.96 0.80 0.85  CALCIUM 9.0 8.8* 8.9  MG  --  2.1  --    Liver Function Tests: Recent Labs  Lab 08/12/18 2104  AST 15  ALT 14  ALKPHOS 89  BILITOT 0.6  PROT 7.1  ALBUMIN 3.9   Recent Labs  Lab 08/12/18 2104  LIPASE 34   No results for input(s): AMMONIA in the last 168 hours. CBC: Recent Labs  Lab 08/12/18 2104 08/13/18 0332 08/15/18 0431  WBC 7.2 6.8 6.3  NEUTROABS  --  4.1  --   HGB 14.3 13.6 13.8  HCT 44.7 42.9 42.5  MCV 92.0 92.7 89.1  PLT 280 265 221   Cardiac Enzymes: No results for input(s): CKTOTAL, CKMB, CKMBINDEX, TROPONINI in the last 168 hours. BNP: Invalid input(s): POCBNP CBG: Recent Labs  Lab 08/15/18 0755 08/15/18 1135 08/15/18 1615 08/15/18 2047 08/16/18 0743  GLUCAP 103* 84 86 84 168*   D-Dimer No results for input(s): DDIMER in the last 72 hours. Hgb A1c Recent Labs    08/15/18 0431  HGBA1C 7.1*   Lipid Profile No results for input(s): CHOL, HDL, LDLCALC, TRIG, CHOLHDL, LDLDIRECT in the last 72 hours. Thyroid function studies No results for input(s): TSH, T4TOTAL, T3FREE, THYROIDAB in the last 72 hours.  Invalid input(s): FREET3 Anemia work up No results for input(s): VITAMINB12, FOLATE, FERRITIN, TIBC, IRON, RETICCTPCT in the last 72 hours. Urinalysis    Component Value Date/Time   COLORURINE YELLOW 08/13/2018 0105   APPEARANCEUR CLEAR 08/13/2018 0105   LABSPEC 1.029 08/13/2018 0105   PHURINE 5.0 08/13/2018 0105   GLUCOSEU NEGATIVE 08/13/2018 0105   HGBUR NEGATIVE 08/13/2018 0105   BILIRUBINUR NEGATIVE 08/13/2018 0105   KETONESUR NEGATIVE 08/13/2018 0105   PROTEINUR NEGATIVE 08/13/2018 0105   NITRITE NEGATIVE 08/13/2018 0105   LEUKOCYTESUR NEGATIVE 08/13/2018 0105   Sepsis Labs Invalid input(s): PROCALCITONIN,  WBC,  LACTICIDVEN Microbiology Recent Results (from the past 240 hour(s))  Blood culture (routine x 2)     Status: None (Preliminary result)   Collection  Time: 08/13/18  2:18 AM  Result Value Ref Range Status   Specimen Description BLOOD LEFT ANTECUBITAL  Final   Special Requests   Final    BOTTLES DRAWN AEROBIC AND ANAEROBIC Blood Culture adequate volume   Culture NO GROWTH 3 DAYS  Final   Report Status PENDING  Incomplete  Blood culture (routine x 2)     Status: None (Preliminary result)   Collection Time: 08/13/18  2:29 AM  Result Value Ref Range Status   Specimen Description BLOOD RIGHT UPPER ARM  Final   Special Requests   Final    BOTTLES DRAWN AEROBIC AND ANAEROBIC Blood Culture adequate volume   Culture NO GROWTH 3 DAYS  Final   Report Status PENDING  Incomplete     Time coordinating discharge: 40 minutes  SIGNED:   Bess Harvest  Olevia Bowens, MD  [facility]

## 2018-08-16 NOTE — Progress Notes (Signed)
Patient Discharge: Disposition: Patient discharged to home. Education: Reviewed medications, prescriptions, follow-up appointments and discharge instructions, verbalized understanding. IV: Discontinued IV before discharge. Telemetry:  N/A Transportation: Patient escorted out of the unit in w/c. Belongings: She took all her belongings with her.  

## 2018-08-18 LAB — CULTURE, BLOOD (ROUTINE X 2)
Culture: NO GROWTH
Culture: NO GROWTH
Special Requests: ADEQUATE
Special Requests: ADEQUATE

## 2018-08-29 ENCOUNTER — Encounter: Payer: Self-pay | Admitting: Pulmonary Disease

## 2018-08-29 ENCOUNTER — Ambulatory Visit: Payer: Medicare Other | Admitting: Pulmonary Disease

## 2018-08-29 ENCOUNTER — Other Ambulatory Visit: Payer: Self-pay

## 2018-08-29 ENCOUNTER — Telehealth: Payer: Self-pay | Admitting: Pulmonary Disease

## 2018-08-29 ENCOUNTER — Inpatient Hospital Stay: Payer: Medicare Other | Admitting: Pulmonary Disease

## 2018-08-29 VITALS — BP 124/60 | HR 97 | Ht 65.5 in | Wt 180.8 lb

## 2018-08-29 DIAGNOSIS — J984 Other disorders of lung: Secondary | ICD-10-CM | POA: Diagnosis not present

## 2018-08-29 DIAGNOSIS — F17210 Nicotine dependence, cigarettes, uncomplicated: Secondary | ICD-10-CM

## 2018-08-29 DIAGNOSIS — K769 Liver disease, unspecified: Secondary | ICD-10-CM

## 2018-08-29 DIAGNOSIS — R918 Other nonspecific abnormal finding of lung field: Secondary | ICD-10-CM | POA: Diagnosis not present

## 2018-08-29 DIAGNOSIS — F17200 Nicotine dependence, unspecified, uncomplicated: Secondary | ICD-10-CM | POA: Insufficient documentation

## 2018-08-29 NOTE — Telephone Encounter (Signed)
08/29/2018 1422  Called and left message for PCP - Dr. Darlina Sicilian to be notified of 08/13/2018 CT Angio  results showing:   3. 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 4. Indeterminate liver lesions. Further characterization with MRI on a nonemergent basis recommended.  I wanted to notify PCP so PCP can follow up with patient and proceed forward with recommended Liver MRI.   Spoke with PCP. They will follow up on liver lesions.   Routing to [employer] as FYI.   Wyn Quaker FNP

## 2018-08-29 NOTE — Assessment & Plan Note (Signed)
Assessment: CT abdomen shows indeterminate liver lesions Radiology recommending liver MRI  Plan: I contacted patient's primary care provider and discuss these results with him.  See telephone note from 08/29/2018 regarding this.  PCP is aware of liver lesions and will proceed forward with work-up.

## 2018-08-29 NOTE — Patient Instructions (Addendum)
We will get you scheduled to see Dr. Valeta Harms in 2 weeks  Please schedule an appointment with pulmonary function testing to be completed in 4 weeks   We recommend that you stop smoking.  >>>You need to set a quit date >>>If you have friends or family who smoke, let them know you are trying to quit and not to smoke around you or in your living environment  Smoking Cessation Resources:  1 800 QUIT NOW  >>> Patient to call this resource and utilize it to help support her quit smoking >>> Keep up your hard work with stopping smoking  You can also contact the [facility] >>>For smoking cessation classes call [phone]  We do not recommend using e-cigarettes as a form of stopping smoking  You can sign up for smoking cessation support texts and information:  >>>https://smokefree.gov/smokefreetxt   Nicotine patches: >>>Make sure you rotate sites that you do not get skin irritation, Apply 1 patch each morning to a non-hairy skin site  If you are smoking greater than 10 cigarettes/day and weigh over 45 kg start with the nicotine patch of 21 mg a day for 6 weeks, then 14 mg a day for 2 weeks, then finished with 7 mg a day for 2 weeks, then stop   >>>If insomnia occurs you are having trouble sleeping you can take the patch off at night, and place a new one on in the morning >>>If the patch is removed at night and you have morning cravings start short acting nicotine replacement therapy such as gum or lozenges  Nicotine Gum:  >>>Smokers who smoke 25 or more cigarettes a day should use 4 mg dose  Proper chewing of gum is important for optimal results.   >>>Do not chew gum to rapidly.  Once you start chewing eating tasty peppery taste and slide the gum to your cheek.  When the taste disappears to a few more times.  Repeat this for 30 minutes.  Then discard the gum.  >>>Avoid acidic beverages such as coffee, carbonated beverages before and during gum use.  A soft acidic beverages  lower oral pH which cause nicotine to not be absorbed properly >>>If you chew the gum too quickly or vigorously you could have nausea, vomiting, abdominal pain, constipation, hiccups, headache, sore jaw, mouth irritation ulcers  Nicotine lozenge: Lozenges are commonly uses short acting NRT product  >>>Smokers who smoke within 30 minutes of awakening should use 4 mg dose >>>Smokers who wait more than 30 minutes after awakening to smoke should use 2 mg dose  Can use up to 1 lozenge every 1-2 hours for 6 weeks >>>Total amount of lozenges that can be used per day as 20 >>>Gradually reduce number of lozenges used per day after 2 weeks of use  Place lozenge in mouth and allowed to dissolve for 30 minutes loss and does not need to be chewed  Lozenges have advantages to be able to be used in people with TMG, poor dentition, dentures    Please contact your primary care provider and let them know that you had a CT that showed liver lesions that radiology recommended a liver MRI for them to follow-up on     Follow-up with Dr. Valeta Harms in 2 weeks, if there is nothing available within 2 weeks please let Ander Purpura, Marye Round, or Wyn Quaker, FNP   Please schedule patient for pulmonary function testing in 4 weeks     It is flu season:   >>> Best ways to  protect herself from the flu: Receive the yearly flu vaccine, practice good hand hygiene washing with soap and also using hand sanitizer when available, eat a nutritious meals, get adequate rest, hydrate appropriately   Please contact the office if your symptoms worsen or you have concerns that you are not improving.   Thank you for choosing [facility] for your healthcare, and for allowing [place] to partner with you on your healthcare journey. I am thankful to be able to provide care to you today.   Wyn Quaker FNP-C

## 2018-08-29 NOTE — Assessment & Plan Note (Signed)
Assessment: Current everyday smoker Smoking half a pack per day 27 pack years Mild emphysema on February 2020 CT chest  Plan: Pulmonary function test Praised patient for wanting to stop smoking Patient reports she has purchased the 21 mg nicotine patches Discussed nicotine gum versus nicotine lozenge use, patient reports that she is familiar with these and will proceed forward with 4 mg gum

## 2018-08-29 NOTE — Progress Notes (Signed)
@Patient  ID: Sara Powell, female    DOB: 04/20/1951, 68 y.o.   MRN: [id number]  Chief Complaint  Patient presents with  . Hospitalization Follow-up    follow up abnormal ct     Referring provider: Buzzy Han*  HPI:  68 year old female current every day smoker consulted with our team on 2018-09-03 during the hospitalization on 08/12/2018 complaining of right-sided chest rib and upper abdominal pain.  Work-up was negative for PE but showed innumerable pulmonary nodules with cavitation.  PMH: Prediabetes, depression, hypertension, anemia, fibromyalgia Smoker/ Smoking History: Current everyday smoker.  Smoking half a pack per day. 27 pack years.  Maintenance:  None  Pt of: Dr. Valeta Harms  Recent [place] [facility] [facility] Encounters:   2018-09-03-consult note-[place] Plan: Autoimmune lab work, hold on steroids, follow-up outpatient, will need PFTs to further evaluate emphysema, doxy  08/29/2018  - Visit   68 year old female current every day smoker initially consulted with our team during her recent hospitalization on 2018/09/03 due to an abnormal CT angiogram which showed innumerable pulmonary nodules with central cavitation and mild emphysema.  Patient's autoimmune lab work drawn on Sep 03, 2018 was unremarkable.  Patient reporting that since discharge she has been doing well but is interested in further figuring out why she has an abnormal CAT scan.  She was initially scheduled today to see Dr. Valeta Harms to establish with him for pulmonary care.  Also there is a suspicion that she may need a bronchoscopy outpatient.  Patient also needs formal pulmonary function testing.  She reports that she had an office spirometry at her primary care office.  We do not have those results.  Patient reports that after discharge she did complete both the doxycycline as well as the Augmentin prescriptions that were prescribed to her.  Patient reports that she had severe back pain which is what prompted her to  present to the emergency room.  She emphasizes that the pain was as severe as labor pains.  She still does not have a clear understanding of why she was having this pain.  Said that that pain has since resolved and it resolved in the hospital.    Tests:   CTA Chest / ABD / Pelvis 2/24 >> no aortic dissection or aneurysm, negative for PE. 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  Imaging: Dg Chest 2 View  Result Date: 08/12/2018 CLINICAL DATA:  Chest pain EXAM: CHEST - 2 VIEW COMPARISON:  None. FINDINGS: Heart and mediastinal contours are within normal limits. No focal opacities or effusions. No acute bony abnormality. IMPRESSION: No active cardiopulmonary disease. Electronically Signed   By: Rolm Baptise M.D.   On: 08/12/2018 21:44   Ct Angio Chest/abd/pel For Dissection W And/or Wo Contrast  Result Date: 2018/09/03 CLINICAL DATA:  68 year old female with chest and back pain. Concern for dissection. EXAM: CT ANGIOGRAPHY CHEST, ABDOMEN AND PELVIS TECHNIQUE: Multidetector CT imaging through the chest, abdomen and pelvis was performed using the standard protocol during bolus administration of intravenous contrast. Multiplanar reconstructed images and MIPs were obtained and reviewed to evaluate the vascular anatomy. CONTRAST:  174mL ISOVUE-370 IOPAMIDOL (ISOVUE-370) INJECTION 76% COMPARISON:  Chest radiograph dated 08/12/2018 FINDINGS: CTA CHEST FINDINGS  Cardiovascular: Borderline  cardiomegaly. No pericardial effusion. Minimal atherosclerotic calcification of the thoracic aorta. No aneurysmal dilatation or dissection. The origins of the great vessels of the aortic arch are patent. There is no CT evidence of pulmonary embolism. Mediastinum/Nodes: No hilar or mediastinal adenopathy. The esophagus the thyroid gland are grossly unremarkable. No mediastinal fluid collection. Lungs/Pleura: There are innumerable scattered pulmonary nodules measuring up to 7 mm predominantly in the mid to upper lung field. Most of nodules demonstrate central cavitation. Findings may represent a fungal infection, abscess, or TB. Other etiologies are not excluded. Clinical correlation and follow-up recommended. No lobar consolidation, pleural effusion, or pneumothorax. The central airways are patent. Musculoskeletal: No chest wall abnormality. No acute or significant osseous findings. Review of the MIP images confirms the above findings. CTA ABDOMEN AND PELVIS FINDINGS VASCULAR Aorta: Mild atherosclerotic disease. No aneurysmal dilatation or dissection. 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 Celiac: Patent without evidence of aneurysm, dissection, vasculitis or significant stenosis. SMA: The SMA is patent. 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 Renals: Both renal arteries are patent without evidence of aneurysm, dissection, vasculitis, fibromuscular dysplasia or significant stenosis. IMA: Patent without evidence of aneurysm, dissection, vasculitis or significant stenosis. Inflow: Patent without evidence of aneurysm, dissection, vasculitis or significant stenosis. Veins: No obvious venous abnormality within the limitations of this arterial phase study. Review of the MIP images confirms the above findings. NON-VASCULAR No intra-abdominal free air or free fluid. Hepatobiliary: There is a 12 mm hypodense lesion in the right lobe of  the liver inferiorly (series 9, image 142) which demonstrates a somewhat ill-defined margins. This is not characterized. An additional faint subcentimeter hypodense lesion in the inferior right lobe of the liver noted. Further characterization with MRI on a nonemergent basis recommended. No intrahepatic biliary ductal dilatation. The gallbladder is unremarkable. Pancreas: Unremarkable. No pancreatic ductal dilatation or surrounding inflammatory changes. Spleen: Normal in size without focal abnormality. Adrenals/Urinary Tract: Mild thickening of the lateral limb of the left adrenal gland measuring up to 10 mm, possibly hyperplasia or adenoma. The right adrenal gland is unremarkable. There is no hydronephrosis on either side. There is an 11 mm nonobstructing left renal interpolar calculus. Additional punctate nonobstructing stone in the inferior pole of the left kidney. The visualized ureters and urinary bladder appear unremarkable. Stomach/Bowel: There is no bowel obstruction or active inflammation. Normal appendix. Lymphatic: No adenopathy. Reproductive: Hysterectomy. No pelvic mass. Other: Small fat containing umbilical hernia. Musculoskeletal: No acute osseous pathology. Review of the MIP images confirms the above findings. IMPRESSION: 1. No aortic dissection or aneurysm. No CT evidence of pulmonary embolism. 2. Innumerable pulmonary nodules with central cavitation. Findings may represent a fungal infection, abscess, or TB. Other etiologies are not excluded. Clinical correlation and follow-up recommended. 3. 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 4. Indeterminate liver lesions. Further characterization with MRI on a nonemergent basis recommended. 5. Nonobstructing left renal calculi. No hydronephrosis. Electronically Signed   By: Anner Crete M.D.   On: 08/13/2018 01:27   US Abdomen Limited Ruq  Result Date: 08/12/2018 CLINICAL DATA:  Initial evaluation for acute abdominal pain, right upper quadrant. EXAM:  ULTRASOUND ABDOMEN LIMITED RIGHT UPPER QUADRANT COMPARISON:  None. FINDINGS: Gallbladder: No gallstones or wall thickening visualized. No sonographic Murphy sign noted by sonographer. Common bile duct: Diameter: 3.5 mm Liver: No focal lesion identified. Within normal limits in parenchymal echogenicity. Portal vein is patent on color Doppler imaging with normal direction of blood  flow towards the liver. IMPRESSION: Normal right upper quadrant ultrasound. Electronically Signed   By: Jeannine Boga M.D.   On: 08/12/2018 23:35      Specialty Problems      Pulmonary Problems   Cavitary lung disease    CTA Chest / ABD / Pelvis 2/24 >> no aortic dissection or aneurysm, negative for PE. 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  Aug 28, 2018-ANCA-negative 28-Aug-2018-ACE-5 08-28-18-sed rate-25 28-Aug-2018-MPO/PR-3 ANCA antibodies-negative 08-28-18-ANA- -08-28-18-anti-scleroderma antibody-negative 08/28/2018-Sjrogens syndrome-negative  28-Aug-2018-HIV antibody-nonreactive 2018-08-28-blood cultures-no growth 08/28/18- QuantiFERON-TB gold-negative         Allergies  Allergen Reactions  . Penicillins Hives and Itching    Did it involve swelling of the face/tongue/throat, SOB, or low BP? No Did it involve sudden or severe rash/hives, skin peeling, or any reaction on the inside of your mouth or nose? Yes Did you need to seek medical attention at a hospital or doctor's office? No When did it last happen?More than 10 years ago If all above answers are "NO", may proceed with cephalosporin use.   Roselee Culver [Oxycodone-Acetaminophen] Hives and Itching    Immunization History  Administered Date(s) Administered  . Influenza, High Dose Seasonal PF 03/17/2018  . Influenza-Unspecified 06/22/2014  . Pneumococcal Polysaccharide-23 03/17/2018    Past Medical History:  Diagnosis Date  . Anemia   . Arthritis   . Colitis    . Depression   . Diabetes mellitus without complication ([place])    Pre-diabeti/NO MEDS  . Fibroid    uterine  . Fibromyalgia   . Hypertension   . Lactose intolerance   . Menorrhagia     Tobacco History: Social History   Tobacco Use  Smoking Status Current Every Day Smoker  . Packs/day: 0.50  . Years: 54.00  . Pack years: 27.00  . Types: Cigarettes  . Start date: 08/28/1964  Smokeless Tobacco Never Used  Tobacco Comment   7 per day 08/29/18   Ready to quit: Not Answered Counseling given: Yes Comment: 7 per day 08/29/18  Smoking assessment and cessation counseling  Patient currently smoking: 7 cigarettes I have advised the patient to quit/stop smoking as soon as possible due to high risk for multiple medical problems.  It will also be very difficult for [place] to manage patient's  respiratory symptoms and status if we continue to expose her lungs to a known irritant.  We do not advise e-cigarettes as a form of stopping smoking.  Patient is willing to quit smoking.  Patient has not set quit date.  I have advised the patient that we can assist and have options of nicotine replacement therapy, provided smoking cessation education today, provided smoking cessation counseling, and provided cessation resources. Pt has purchased 21 mg nicotine patches.  She has also used gum before in the past.  She reports she probably will also start using 4 mg gum.  Follow-up next office visit office visit for assessment of smoking cessation.  Smoking cessation counseling advised for: 4 min    Outpatient Encounter Medications as of 08/29/2018  Medication Sig  . aspirin 81 MG tablet Take 81 mg by mouth daily.  . B Complex-Biotin-FA (B-COMPLEX PO) Take by mouth daily.  . diclofenac sodium (VOLTAREN) 1 % GEL Apply 4 g topically 3 (three) times daily as needed for pain.  Marland Kitchen escitalopram (LEXAPRO) 10 MG tablet Take 10 mg by mouth daily.  . hydrochlorothiazide (HYDRODIURIL) 25 MG tablet Take 25 mg by  mouth daily.  . hydrocortisone  2.5 % cream Apply 1 application topically daily as needed for itching.  Marland Kitchen ketoconazole (NIZORAL) 2 % shampoo Apply 1 application topically daily as needed for rash.  . latanoprost (XALATAN) 0.005 % ophthalmic solution Place 1 drop into both eyes at bedtime.   Marland Kitchen lisinopril (PRINIVIL,ZESTRIL) 20 MG tablet Take 20 mg by mouth daily.  . Multiple Vitamin (MULTIVITAMIN) tablet Take 1 tablet by mouth daily.  . pravastatin (PRAVACHOL) 40 MG tablet Take 40 mg by mouth daily.  Marland Kitchen tiZANidine (ZANAFLEX) 4 MG capsule Take 4 mg by mouth daily.   . traMADol (ULTRAM) 50 MG tablet Take 50 mg by mouth daily as needed for moderate pain.   . traZODone (DESYREL) 50 MG tablet Take 50 mg by mouth at bedtime.   . Influenza Vac Split High-Dose (FLUZONE HIGH-DOSE IM) Inject into the muscle.  . pneumococcal 13-valent conjugate vaccine (PREVNAR 13) SUSP injection Inject 0.5 mLs into the muscle once. To be administered by a pharmacist  . [DISCONTINUED] amoxicillin-clavulanate (AUGMENTIN) 875-125 MG tablet Take 1 tablet by mouth 2 (two) times daily for 14 days. (Patient not taking: Reported on 08/29/2018)   No facility-administered encounter medications on file as of 08/29/2018.      Review of Systems  Review of Systems  Constitutional: Positive for fatigue (Known fibromyalgia). Negative for chills, fever and unexpected weight change.  HENT: Negative for congestion, ear pain, postnasal drip, sinus pressure and sinus pain.   Respiratory: Negative for cough, chest tightness, shortness of breath and wheezing.   Cardiovascular: Negative for chest pain and palpitations.  Gastrointestinal: Negative for diarrhea, nausea and vomiting.  Genitourinary: Negative for hematuria and urgency.  Skin: Negative for color change.  Allergic/Immunologic: Negative for environmental allergies and food allergies.  Neurological: Negative for dizziness, light-headedness and headaches.  Psychiatric/Behavioral:  Negative for dysphoric mood. The patient is nervous/anxious.   All other systems reviewed and are negative.    Physical Exam  BP 124/60 (BP Location: Left Arm, Cuff Size: Large)   Pulse 97   Ht 5' 5.5" (1.664 m)   Wt 180 lb 12.8 oz (82 kg)   SpO2 92%   BMI 29.63 kg/m   Wt Readings from Last 5 Encounters:  08/29/18 180 lb 12.8 oz (82 kg)  08/15/18 176 lb 12.9 oz (80.2 kg)  12/11/16 184 lb (83.5 kg)  01/19/16 185 lb (83.9 kg)  01/05/16 185 lb (83.9 kg)     Physical Exam  Constitutional: She is oriented to person, place, and time and well-developed, well-nourished, and in no distress. No distress.  HENT:  Head: Normocephalic and atraumatic.  Right Ear: Hearing, tympanic membrane, external ear and ear canal normal.  Left Ear: Hearing, tympanic membrane, external ear and ear canal normal.  Nose: Mucosal edema and rhinorrhea present. Right sinus exhibits no maxillary sinus tenderness and no frontal sinus tenderness. Left sinus exhibits no maxillary sinus tenderness and no frontal sinus tenderness.  Mouth/Throat: Uvula is midline and oropharynx is clear and moist. No oropharyngeal exudate.  PND  Eyes: Pupils are equal, round, and reactive to light.  Neck: Normal range of motion. Neck supple.  Cardiovascular: Normal rate, regular rhythm and normal heart sounds.  Pulmonary/Chest: Effort normal and breath sounds normal. No accessory muscle usage. No respiratory distress. She has no decreased breath sounds. She has no wheezes. She has no rhonchi.  Abdominal: Soft. Bowel sounds are normal. There is no abdominal tenderness.  Musculoskeletal: Normal range of motion.        General: No edema.  Lymphadenopathy:    She has no cervical adenopathy.  Neurological: She is alert and oriented to person, place, and time. Gait normal.  Skin: Skin is warm and dry. She is not diaphoretic. No erythema.  Psychiatric: Mood, memory, affect and judgment normal.  Nursing note and vitals reviewed.      Lab Results:  CBC    Component Value Date/Time   WBC 6.3 08/15/2018 0431   RBC 4.77 08/15/2018 0431   HGB 13.8 08/15/2018 0431   HCT 42.5 08/15/2018 0431   PLT 221 08/15/2018 0431   MCV 89.1 08/15/2018 0431   MCH 28.9 08/15/2018 0431   MCHC 32.5 08/15/2018 0431   RDW 12.7 08/15/2018 0431   LYMPHSABS 2.1 08/13/2018 0332   MONOABS 0.4 08/13/2018 0332   EOSABS 0.1 08/13/2018 0332   BASOSABS 0.0 08/13/2018 0332    BMET    Component Value Date/Time   NA 141 08/15/2018 0431   K 3.6 08/15/2018 0431   CL 106 08/15/2018 0431   CO2 25 08/15/2018 0431   GLUCOSE 113 (H) 08/15/2018 0431   BUN 14 08/15/2018 0431   CREATININE 0.85 08/15/2018 0431   CALCIUM 8.9 08/15/2018 0431   GFRNONAA >60 08/15/2018 0431   GFRAA >60 08/15/2018 0431    BNP No results found for: BNP  ProBNP No results found for: PROBNP    Assessment & Plan:     Cavitary lung disease Assessment: 08/12/2018-CTA chest showing innumerable pulmonary nodules with central cavitation Treated with IV antibiotics, discharged with doxycycline and Augmentin Autoimmune profile unremarkable Negative quant gold Negative HIV Negative blood cultures Lungs clear to auscultation today  Plan: 2-week follow-up to establish with Dr. Valeta Harms for pulmonary care Consider follow-up CT chest in 6 to 8 weeks if patient remains clinically stable >>> If repeat CT chest she will shows innumerable pulmonary nodules then consider bronchoscopy for further evaluation with BAL and tissue sample   Liver lesion Assessment: CT abdomen shows indeterminate liver lesions Radiology recommending liver MRI  Plan: I contacted patient's primary care provider and discuss these results with him.  See telephone note from 08/29/2018 regarding this.  PCP is aware of liver lesions and will proceed forward with work-up.  Current smoker Assessment: Current everyday smoker Smoking half a pack per day 27 pack years Mild emphysema on February 2020  CT chest  Plan: Pulmonary function test Praised patient for wanting to stop smoking Patient reports she has purchased the 21 mg nicotine patches Discussed nicotine gum versus nicotine lozenge use, patient reports that she is familiar with these and will proceed forward with 4 mg gum     Lauraine Rinne, NP 08/29/2018   This appointment was 42 min long with over 50% of the time in direct face-to-face patient care, assessment, plan of care, telephone conversation with PCP, and follow-up.

## 2018-08-29 NOTE — Assessment & Plan Note (Signed)
Assessment: 08/12/2018-CTA chest showing innumerable pulmonary nodules with central cavitation Treated with IV antibiotics, discharged with doxycycline and Augmentin Autoimmune profile unremarkable Negative quant gold Negative HIV Negative blood cultures Lungs clear to auscultation today  Plan: 2-week follow-up to establish with Dr. Valeta Harms for pulmonary care Consider follow-up CT chest in 6 to 8 weeks if patient remains clinically stable >>> If repeat CT chest she will shows innumerable pulmonary nodules then consider bronchoscopy for further evaluation with BAL and tissue sample

## 2018-09-02 NOTE — Telephone Encounter (Signed)
PCCM: Agreed, thanks for letting PCP know.  Garner Nash, DO [facility] 09/02/2018 8:03 AM

## 2018-09-02 NOTE — Telephone Encounter (Signed)
No problem.  Sara Powell

## 2018-09-03 NOTE — Progress Notes (Signed)
PCCM: I will be glad to establish care. Thanks for seeing  Garner Nash, DO [facility] 09/03/2018 3:28 PM

## 2018-09-09 ENCOUNTER — Other Ambulatory Visit: Payer: Self-pay | Admitting: Family Medicine

## 2018-09-09 DIAGNOSIS — K769 Liver disease, unspecified: Secondary | ICD-10-CM

## 2018-09-12 ENCOUNTER — Ambulatory Visit: Payer: Medicare Other | Admitting: Pulmonary Disease

## 2018-09-17 ENCOUNTER — Other Ambulatory Visit: Payer: Self-pay

## 2018-09-17 ENCOUNTER — Ambulatory Visit
Admission: RE | Admit: 2018-09-17 | Discharge: 2018-09-17 | Disposition: A | Discharge status: Home / Self Care | Payer: Medicare Other | Source: Ambulatory Visit | Attending: Family Medicine | Admitting: Family Medicine

## 2018-09-17 DIAGNOSIS — K769 Liver disease, unspecified: Secondary | ICD-10-CM

## 2018-09-17 MED ORDER — GADOBENATE DIMEGLUMINE 529 MG/ML IV SOLN
17.0000 mL | Freq: Once | INTRAVENOUS | Status: AC | PRN
  Administered 2018-09-17: 17 mL via INTRAVENOUS

## 2018-09-24 ENCOUNTER — Other Ambulatory Visit: Payer: Medicare Other

## 2018-11-13 ENCOUNTER — Telehealth: Payer: Self-pay | Admitting: Pulmonary Disease

## 2018-11-14 ENCOUNTER — Other Ambulatory Visit: Payer: Self-pay | Admitting: Pulmonary Disease

## 2018-11-14 NOTE — Telephone Encounter (Signed)
Pt has been scheduled for PFT on 11/28/2018 and COVID screening for 11/25/2018. Pt aware. Will sign off.

## 2018-11-25 ENCOUNTER — Other Ambulatory Visit (HOSPITAL_COMMUNITY)
Admission: RE | Admit: 2018-11-25 | Discharge: 2018-11-25 | Disposition: A | Discharge status: Home / Self Care | Payer: Medicare Other | Source: Ambulatory Visit | Attending: Pulmonary Disease | Admitting: Pulmonary Disease

## 2018-11-25 DIAGNOSIS — Z1159 Encounter for screening for other viral diseases: Secondary | ICD-10-CM | POA: Diagnosis present

## 2018-11-26 LAB — NOVEL CORONAVIRUS, NAA (HOSP ORDER, SEND-OUT TO REF LAB; TAT 18-24 HRS): SARS-CoV-2, NAA: NOT DETECTED

## 2018-11-26 NOTE — Progress Notes (Signed)
Sars COV2 negative. This is good news.   Wyn Quaker FNP

## 2018-11-27 NOTE — Progress Notes (Signed)
Called spoke with patient, advised of lab results / recs as stated by [facility] NP.  Pt verbalized understanding and denied any questions.

## 2018-11-28 ENCOUNTER — Other Ambulatory Visit: Payer: Self-pay

## 2018-11-28 ENCOUNTER — Ambulatory Visit (INDEPENDENT_AMBULATORY_CARE_PROVIDER_SITE_OTHER): Payer: Medicare Other | Admitting: Pulmonary Disease

## 2018-11-28 DIAGNOSIS — J984 Other disorders of lung: Secondary | ICD-10-CM

## 2018-11-28 DIAGNOSIS — F17200 Nicotine dependence, unspecified, uncomplicated: Secondary | ICD-10-CM

## 2018-11-28 LAB — PULMONARY FUNCTION TEST
DL/VA % pred: 92 %
DL/VA: 3.8 ml/min/mmHg/L
DLCO unc % pred: 80 %
DLCO unc: 16.25 ml/min/mmHg
FEF 25-75 Post: 2.19 L/sec
FEF 25-75 Pre: 2 L/sec
FEF2575-%Change-Post: 9 %
FEF2575-%Pred-Post: 122 %
FEF2575-%Pred-Pre: 112 %
FEV1-%Change-Post: 2 %
FEV1-%Pred-Post: 109 %
FEV1-%Pred-Pre: 106 %
FEV1-Post: 2.13 L
FEV1-Pre: 2.08 L
FEV1FVC-%Change-Post: 1 %
FEV1FVC-%Pred-Pre: 103 %
FEV6-%Change-Post: 0 %
FEV6-%Pred-Post: 107 %
FEV6-%Pred-Pre: 107 %
FEV6-Post: 2.6 L
FEV6-Pre: 2.59 L
FEV6FVC-%Change-Post: 0 %
FEV6FVC-%Pred-Post: 103 %
FEV6FVC-%Pred-Pre: 103 %
FVC-%Change-Post: 0 %
FVC-%Pred-Post: 104 %
FVC-%Pred-Pre: 103 %
FVC-Post: 2.61 L
FVC-Pre: 2.6 L
Post FEV1/FVC ratio: 82 %
Post FEV6/FVC ratio: 100 %
Pre FEV1/FVC ratio: 80 %
Pre FEV6/FVC Ratio: 100 %
RV % pred: 100 %
RV: 2.25 L
TLC % pred: 89 %
TLC: 4.69 L

## 2018-11-28 NOTE — Progress Notes (Signed)
PFT done today. 

## 2018-11-29 NOTE — Progress Notes (Signed)
PFT results preliminary read looks like normal spirometry.  This is good news.  Patient needs to be scheduled for tentative follow-up with our office in 8-10 weeks with me to further discuss.  If Dr. Valeta Harms opens up clinic over that time we will need to get the patient added to his schedule, and cancel the follow-up with me.  Routing to Lauren to contact the patient.  Routing to [place] to add to list for patients for Dr. Valeta Harms. Routing to icard as FYI.   Wyn Quaker, FNP

## 2018-12-09 NOTE — Progress Notes (Signed)
Spoke with the pt and notified of results  The appt for f/u was scheduled

## 2018-12-09 NOTE — Progress Notes (Signed)
See above result note. Not sure if patient has been called yet.   Sara Powell

## 2018-12-23 ENCOUNTER — Telehealth: Payer: Self-pay | Admitting: Pulmonary Disease

## 2018-12-24 NOTE — Telephone Encounter (Signed)
Called and left message on pt vm to call back to schedule f/u with Dr. Valeta Harms after 01/29/2019 -pr

## 2018-12-26 NOTE — Telephone Encounter (Signed)
Patient scheduled with Dr. Valeta Harms on 0813/2020 -pr

## 2019-01-29 NOTE — Progress Notes (Deleted)
Synopsis: Referred in August 2020 for Cavitary lung nodules by Buzzy Han*  Subjective:   PATIENT ID: Sara Powell GENDER: female DOB: 23-Nov-1950, MRN: [id number]  No chief complaint on file.   HPI  ***  Past Medical History:  Diagnosis Date  . Anemia   . Arthritis   . Colitis   . Depression   . Diabetes mellitus without complication ([place])    Pre-diabeti/NO MEDS  . Fibroid    uterine  . Fibromyalgia   . Hypertension   . Lactose intolerance   . Menorrhagia      Family History  Problem Relation Age of Onset  . Diabetes Mother   . Hypertension Mother   . Hypertension Father   . Colon cancer Sister   . Diabetes Brother   . Colon polyps Sister   . Heart disease Maternal Aunt   . Heart attack Maternal Aunt   . Cancer Maternal Aunt        stomach  . Diabetes Paternal Aunt   . Diabetes Maternal Grandmother   . Stroke Maternal Grandmother   . Cancer Maternal Grandfather        colon  . Colon cancer Maternal Grandfather   . Diabetes Paternal Grandmother      Past Surgical History:  Procedure Laterality Date  . ABDOMINAL HYSTERECTOMY    . BREAST BIOPSY Left   . COLONOSCOPY    . LAPAROSCOPIC SALPINGO OOPHERECTOMY    . POLYPECTOMY    . ROTATOR CUFF REPAIR     right shoulder  . sty  on eyelid     left eye/removed 2 times  . TUBAL LIGATION      Social History   Socioeconomic History  . Marital status: Married    Spouse name: Not on file  . Number of children: Not on file  . Years of education: Not on file  . Highest education level: Not on file  Occupational History  . Not on file  Social Needs  . Financial resource strain: Not on file  . Food insecurity    Worry: Not on file    Inability: Not on file  . Transportation needs    Medical: Not on file    Non-medical: Not on file  Tobacco Use  . Smoking status: Current Every Day Smoker    Packs/day: 0.50    Years: 54.00    Pack years: 27.00    Types: Cigarettes    Start date:  08/28/1964  . Smokeless tobacco: Never Used  . Tobacco comment: 7 per day 08/29/18  Substance and Sexual Activity  . Alcohol use: Yes    Alcohol/week: 5.0 standard drinks    Types: 5 Standard drinks or equivalent per week    Comment: wine  . Drug use: No  . Sexual activity: Not on file  Lifestyle  . Physical activity    Days per week: Not on file    Minutes per session: Not on file  . Stress: Not on file  Relationships  . Social Herbalist on phone: Not on file    Gets together: Not on file    Attends religious service: Not on file    Active member of club or organization: Not on file    Attends meetings of clubs or organizations: Not on file    Relationship status: Not on file  . Intimate partner violence    Fear of current or ex partner: Not on file    Emotionally abused: Not  on file    Physically abused: Not on file    Forced sexual activity: Not on file  Other Topics Concern  . Not on file  Social History Narrative  . Not on file     Allergies  Allergen Reactions  . Penicillins Hives and Itching    Did it involve swelling of the face/tongue/throat, SOB, or low BP? No Did it involve sudden or severe rash/hives, skin peeling, or any reaction on the inside of your mouth or nose? Yes Did you need to seek medical attention at a hospital or doctor's office? No When did it last happen?More than 10 years ago If all above answers are "NO", may proceed with cephalosporin use.   Roselee Culver [Oxycodone-Acetaminophen] Hives and Itching     Outpatient Medications Prior to Visit  Medication Sig Dispense Refill  . aspirin 81 MG tablet Take 81 mg by mouth daily.    . B Complex-Biotin-FA (B-COMPLEX PO) Take by mouth daily.    . diclofenac sodium (VOLTAREN) 1 % GEL Apply 4 g topically 3 (three) times daily as needed for pain.    Marland Kitchen escitalopram (LEXAPRO) 10 MG tablet Take 10 mg by mouth daily.    . hydrochlorothiazide (HYDRODIURIL) 25 MG tablet Take 25 mg by mouth daily.     . hydrocortisone 2.5 % cream Apply 1 application topically daily as needed for itching.    . Influenza Vac Split High-Dose (FLUZONE HIGH-DOSE IM) Inject into the muscle.    . ketoconazole (NIZORAL) 2 % shampoo Apply 1 application topically daily as needed for rash.    . latanoprost (XALATAN) 0.005 % ophthalmic solution Place 1 drop into both eyes at bedtime.     Marland Kitchen lisinopril (PRINIVIL,ZESTRIL) 20 MG tablet Take 20 mg by mouth daily.    . Multiple Vitamin (MULTIVITAMIN) tablet Take 1 tablet by mouth daily.    . pneumococcal 13-valent conjugate vaccine (PREVNAR 13) SUSP injection Inject 0.5 mLs into the muscle once. To be administered by a pharmacist    . pravastatin (PRAVACHOL) 40 MG tablet Take 40 mg by mouth daily.    Marland Kitchen tiZANidine (ZANAFLEX) 4 MG capsule Take 4 mg by mouth daily.     . traMADol (ULTRAM) 50 MG tablet Take 50 mg by mouth daily as needed for moderate pain.     . traZODone (DESYREL) 50 MG tablet Take 50 mg by mouth at bedtime.      No facility-administered medications prior to visit.     ROS   Objective:  Physical Exam   There were no vitals filed for this visit.   on *** LPM *** RA BMI Readings from Last 3 Encounters:  08/29/18 29.63 kg/m  08/15/18 28.54 kg/m  12/11/16 29.70 kg/m   Wt Readings from Last 3 Encounters:  08/29/18 180 lb 12.8 oz (82 kg)  08/15/18 176 lb 12.9 oz (80.2 kg)  12/11/16 184 lb (83.5 kg)     CBC    Component Value Date/Time   WBC 6.3 08/15/2018 0431   RBC 4.77 08/15/2018 0431   HGB 13.8 08/15/2018 0431   HCT 42.5 08/15/2018 0431   PLT 221 08/15/2018 0431   MCV 89.1 08/15/2018 0431   MCH 28.9 08/15/2018 0431   MCHC 32.5 08/15/2018 0431   RDW 12.7 08/15/2018 0431   LYMPHSABS 2.1 08/13/2018 0332   MONOABS 0.4 08/13/2018 0332   EOSABS 0.1 08/13/2018 0332   BASOSABS 0.0 08/13/2018 0332    ***  Chest Imaging: ***  Pulmonary Functions Testing Results:  PFT Results Latest Ref Rng & Units 11/28/2018  FVC-Pre L 2.60   FVC-Predicted Pre % 103  FVC-Post L 2.61  FVC-Predicted Post % 104  Pre FEV1/FVC % % 80  Post FEV1/FCV % % 82  FEV1-Pre L 2.08  FEV1-Predicted Pre % 106  FEV1-Post L 2.13  DLCO UNC% % 80  DLCO COR %Predicted % 92  TLC L 4.69  TLC % Predicted % 89  RV % Predicted % 100    FeNO: ***  Pathology: ***  Echocardiogram: ***  Heart Catheterization: ***    Assessment & Plan:   No diagnosis found.  Discussion: ***   Current Outpatient Medications:  .  aspirin 81 MG tablet, Take 81 mg by mouth daily., Disp: , Rfl:  .  B Complex-Biotin-FA (B-COMPLEX PO), Take by mouth daily., Disp: , Rfl:  .  diclofenac sodium (VOLTAREN) 1 % GEL, Apply 4 g topically 3 (three) times daily as needed for pain., Disp: , Rfl:  .  escitalopram (LEXAPRO) 10 MG tablet, Take 10 mg by mouth daily., Disp: , Rfl:  .  hydrochlorothiazide (HYDRODIURIL) 25 MG tablet, Take 25 mg by mouth daily., Disp: , Rfl:  .  hydrocortisone 2.5 % cream, Apply 1 application topically daily as needed for itching., Disp: , Rfl:  .  Influenza Vac Split High-Dose (FLUZONE HIGH-DOSE IM), Inject into the muscle., Disp: , Rfl:  .  ketoconazole (NIZORAL) 2 % shampoo, Apply 1 application topically daily as needed for rash., Disp: , Rfl:  .  latanoprost (XALATAN) 0.005 % ophthalmic solution, Place 1 drop into both eyes at bedtime. , Disp: , Rfl:  .  lisinopril (PRINIVIL,ZESTRIL) 20 MG tablet, Take 20 mg by mouth daily., Disp: , Rfl:  .  Multiple Vitamin (MULTIVITAMIN) tablet, Take 1 tablet by mouth daily., Disp: , Rfl:  .  pneumococcal 13-valent conjugate vaccine (PREVNAR 13) SUSP injection, Inject 0.5 mLs into the muscle once. To be administered by a pharmacist, Disp: , Rfl:  .  pravastatin (PRAVACHOL) 40 MG tablet, Take 40 mg by mouth daily., Disp: , Rfl:  .  tiZANidine (ZANAFLEX) 4 MG capsule, Take 4 mg by mouth daily. , Disp: , Rfl:  .  traMADol (ULTRAM) 50 MG tablet, Take 50 mg by mouth daily as needed for moderate pain. , Disp: ,  Rfl:  .  traZODone (DESYREL) 50 MG tablet, Take 50 mg by mouth at bedtime. , Disp: , Rfl:    Garner Nash, DO [facility] 01/29/2019 5:19 PM

## 2019-01-30 ENCOUNTER — Ambulatory Visit: Payer: Medicare Other | Admitting: Pulmonary Disease

## 2019-02-06 ENCOUNTER — Ambulatory Visit: Payer: Medicare Other | Admitting: Pulmonary Disease

## 2019-02-11 NOTE — Progress Notes (Signed)
Synopsis: Referred in August 2020 for lung nodules by Buzzy Han*  Subjective:   PATIENT ID: Sara Powell GENDER: female DOB: 10-Jun-1951, MRN: [id number]  Chief Complaint  Patient presents with   New Patient (Initial Visit)    Needs to establish with Physician. Reports she has COPD. Reports her SOB is at her baseline. She uses albuterol and symbicort daily.     This is a 68 year old female past medical history of diabetes, current smoker.  Presented with originally in February 2020 to [facility] with complaints of right-sided chest rib and upper abdominal pain.  CT imaging was completed negative for PE however revealed multiple small cavitary lesions.  There was innumerable small pulmonary nodules with central cavitation auto inflammatory lab work was completed and patient was discharged to follow-up with pulmonary.,  ANCA negative, sed rate 25, MPO PR-3 antibodies negative, ANA negative, Sjogren's negative, HIV negative, QuantiFERON gold negative, eyelid biopsy in 2017 with granulomatous inflammation and plasma cells.  Also found to have indeterminate liver lesions on CT imaging during previous admission.  Follow-up MRI imaging revealed diagnosis of cavernous hemangioma within the liver.  OV 02/12/2019: Today, patient states that her breathing is much better.  She was told that she had COPD and was started on as needed albuterol and Symbicort.  However pulmonary function tests completed in June 2020 reveals a normal FEV1 FVC ratio, FEV1 of 2.13 L, 109% predicted, normal TLC 89%, DLCO 80%.  She is here today for follow-up and establish care with a pulmonary provider.  She was seen in hospital follow-up after being seen initially in consultation for multiple upper lobe small central cavitating nodules.  She had an auto inflammatory work-up that was negative.  We discussed this hospitalization today in the office.  As for her current respiratory complaints she is doing very  well.  She does have occasional nocturnal wheezing.  She does notice that the initiation of the inhalers by her PCP has improved her symptoms greatly.  She is currently using Symbicort 160, 1 puff twice daily.  She is running into difficulty with cost of this but it is cheaper for her to purchase 3 of them at a time from the pharmacy for a 90-day supply.  She will let us know about her current formulary of inhalers available through her insurance.    Past Medical History:  Diagnosis Date   Anemia    Arthritis    Colitis    Depression    Diabetes mellitus without complication ([facility])    Pre-diabeti/NO MEDS   Fibroid    uterine   Fibromyalgia    Hypertension    Lactose intolerance    Menorrhagia      Family History  Problem Relation Age of Onset   Diabetes Mother    Hypertension Mother    Hypertension Father    Colon cancer Sister    Diabetes Brother    Colon polyps Sister    Heart disease Maternal Aunt    Heart attack Maternal Aunt    Cancer Maternal Aunt        stomach   Diabetes Paternal Aunt    Diabetes Maternal Grandmother    Stroke Maternal Grandmother    Cancer Maternal Grandfather        colon   Colon cancer Maternal Grandfather    Diabetes Paternal Grandmother      Past Surgical History:  Procedure Laterality Date   ABDOMINAL HYSTERECTOMY     BREAST BIOPSY Left  COLONOSCOPY     LAPAROSCOPIC SALPINGO OOPHERECTOMY     POLYPECTOMY     ROTATOR CUFF REPAIR     right shoulder   sty  on eyelid     left eye/removed 2 times   TUBAL LIGATION      Social History   Socioeconomic History   Marital status: Married    Spouse name: Not on file   Number of children: Not on file   Years of education: Not on file   Highest education level: Not on file  Occupational History   Not on file  Social Needs   Financial resource strain: Not on file   Food insecurity    Worry: Not on file    Inability: Not on file    Transportation needs    Medical: Not on file    Non-medical: Not on file  Tobacco Use   Smoking status: Current Every Day Smoker    Packs/day: 0.50    Years: 54.00    Pack years: 27.00    Types: Cigarettes    Start date: 08/28/1964   Smokeless tobacco: Never Used   Tobacco comment: 7 per day 08/29/18  Substance and Sexual Activity   Alcohol use: Yes    Alcohol/week: 5.0 standard drinks    Types: 5 Standard drinks or equivalent per week    Comment: wine   Drug use: No   Sexual activity: Not on file  Lifestyle   Physical activity    Days per week: Not on file    Minutes per session: Not on file   Stress: Not on file  Relationships   Social connections    Talks on phone: Not on file    Gets together: Not on file    Attends religious service: Not on file    Active member of club or organization: Not on file    Attends meetings of clubs or organizations: Not on file    Relationship status: Not on file   Intimate partner violence    Fear of current or ex partner: Not on file    Emotionally abused: Not on file    Physically abused: Not on file    Forced sexual activity: Not on file  Other Topics Concern   Not on file  Social History Narrative   Not on file     Allergies  Allergen Reactions   Penicillins Hives and Itching    Did it involve swelling of the face/tongue/throat, SOB, or low BP? No Did it involve sudden or severe rash/hives, skin peeling, or any reaction on the inside of your mouth or nose? Yes Did you need to seek medical attention at a hospital or doctor's office? No When did it last happen?More than 10 years ago If all above answers are NO, may proceed with cephalosporin use.    Tylox [Oxycodone-Acetaminophen] Hives and Itching     Outpatient Medications Prior to Visit  Medication Sig Dispense Refill   albuterol (VENTOLIN HFA) 108 (90 Base) MCG/ACT inhaler Inhale into the lungs every 6 (six) hours as needed for wheezing or shortness  of breath.     aspirin 81 MG tablet Take 81 mg by mouth daily.     B Complex-Biotin-FA (B-COMPLEX PO) Take by mouth daily.     budesonide-formoterol (SYMBICORT) 160-4.5 MCG/ACT inhaler Inhale 2 puffs into the lungs 2 (two) times daily.     hydrochlorothiazide (HYDRODIURIL) 25 MG tablet Take 25 mg by mouth daily.     hydrocortisone 2.5 % cream  Apply 1 application topically daily as needed for itching.     Influenza Vac Split High-Dose (FLUZONE HIGH-DOSE IM) Inject into the muscle.     ketoconazole (NIZORAL) 2 % shampoo Apply 1 application topically daily as needed for rash.     latanoprost (XALATAN) 0.005 % ophthalmic solution Place 1 drop into both eyes at bedtime.      lisinopril (PRINIVIL,ZESTRIL) 20 MG tablet Take 20 mg by mouth daily.     Multiple Vitamin (MULTIVITAMIN) tablet Take 1 tablet by mouth daily.     pneumococcal 13-valent conjugate vaccine (PREVNAR 13) SUSP injection Inject 0.5 mLs into the muscle once. To be administered by a pharmacist     pravastatin (PRAVACHOL) 40 MG tablet Take 40 mg by mouth daily.     tiZANidine (ZANAFLEX) 4 MG capsule Take 4 mg by mouth daily.      traMADol (ULTRAM) 50 MG tablet Take 50 mg by mouth daily as needed for moderate pain.      traZODone (DESYREL) 50 MG tablet Take 50 mg by mouth at bedtime.      diclofenac sodium (VOLTAREN) 1 % GEL Apply 4 g topically 3 (three) times daily as needed for pain.     escitalopram (LEXAPRO) 10 MG tablet Take 10 mg by mouth daily.     No facility-administered medications prior to visit.     Review of Systems  Constitutional: Negative for chills, fever, malaise/fatigue and weight loss.  HENT: Negative for hearing loss, sore throat and tinnitus.   Eyes: Negative for blurred vision and double vision.  Respiratory: Positive for wheezing. Negative for cough, hemoptysis, sputum production, shortness of breath and stridor.   Cardiovascular: Negative for chest pain, palpitations, orthopnea, leg swelling  and PND.  Gastrointestinal: Negative for abdominal pain, constipation, diarrhea, heartburn, nausea and vomiting.  Genitourinary: Negative for dysuria, hematuria and urgency.  Musculoskeletal: Negative for joint pain and myalgias.  Skin: Negative for itching and rash.  Neurological: Negative for dizziness, tingling, weakness and headaches.  Endo/Heme/Allergies: Negative for environmental allergies. Does not bruise/bleed easily.  Psychiatric/Behavioral: Negative for depression. The patient is not nervous/anxious and does not have insomnia.   All other systems reviewed and are negative.    Objective:  Physical Exam Vitals signs reviewed.  Constitutional:      General: She is not in acute distress.    Appearance: She is well-developed.  HENT:     Head: Normocephalic and atraumatic.  Eyes:     General: No scleral icterus.    Conjunctiva/sclera: Conjunctivae normal.     Pupils: Pupils are equal, round, and reactive to light.  Neck:     Musculoskeletal: Neck supple.     Vascular: No JVD.     Trachea: No tracheal deviation.  Cardiovascular:     Rate and Rhythm: Normal rate and regular rhythm.     Heart sounds: Normal heart sounds. No murmur.  Pulmonary:     Effort: Pulmonary effort is normal. No tachypnea, accessory muscle usage or respiratory distress.     Breath sounds: Normal breath sounds. No stridor. No wheezing, rhonchi or rales.  Abdominal:     General: Bowel sounds are normal. There is no distension.     Palpations: Abdomen is soft.     Tenderness: There is no abdominal tenderness.  Musculoskeletal:        General: No tenderness.  Lymphadenopathy:     Cervical: No cervical adenopathy.  Skin:    General: Skin is warm and dry.  Capillary Refill: Capillary refill takes less than 2 seconds.     Findings: No rash.  Neurological:     Mental Status: She is alert and oriented to person, place, and time.  Psychiatric:        Behavior: Behavior normal.      Vitals:    02/12/19 0949  BP: 130/88  Pulse: 64  Temp: 97.6 F (36.4 C)  TempSrc: Temporal  SpO2: 98%  Weight: 192 lb 12.8 oz (87.5 kg)  Height: 5' 5.5" (1.664 m)   98% on RA BMI Readings from Last 3 Encounters:  02/12/19 31.60 kg/m  08/29/18 29.63 kg/m  08/15/18 28.54 kg/m   Wt Readings from Last 3 Encounters:  02/12/19 192 lb 12.8 oz (87.5 kg)  08/29/18 180 lb 12.8 oz (82 kg)  08/15/18 176 lb 12.9 oz (80.2 kg)     CBC    Component Value Date/Time   WBC 6.3 08/15/2018 0431   RBC 4.77 08/15/2018 0431   HGB 13.8 08/15/2018 0431   HCT 42.5 08/15/2018 0431   PLT 221 08/15/2018 0431   MCV 89.1 08/15/2018 0431   MCH 28.9 08/15/2018 0431   MCHC 32.5 08/15/2018 0431   RDW 12.7 08/15/2018 0431   LYMPHSABS 2.1 08/13/2018 0332   MONOABS 0.4 08/13/2018 0332   EOSABS 0.1 08/13/2018 0332   BASOSABS 0.0 08/13/2018 0332     Chest Imaging: February 2020 CT chest: Multiple upper lobe predominant groundglass nodules at the end of the vascular bundle.  Some with small early areas of cavitation.  Likely infectious. The patient's images have been independently reviewed by me.    Pulmonary Functions Testing Results: PFT Results Latest Ref Rng & Units 11/28/2018  FVC-Pre L 2.60  FVC-Predicted Pre % 103  FVC-Post L 2.61  FVC-Predicted Post % 104  Pre FEV1/FVC % % 80  Post FEV1/FCV % % 82  FEV1-Pre L 2.08  FEV1-Predicted Pre % 106  FEV1-Post L 2.13  DLCO UNC% % 80  DLCO COR %Predicted % 92  TLC L 4.69  TLC % Predicted % 89  RV % Predicted % 100    FeNO: None   Pathology: None   Echocardiogram: None   Heart Catheterization: None     Assessment & Plan:     ICD-10-CM   1. Multiple lung nodules  R91.8   2. Abnormal findings on diagnostic imaging of lung  R91.8 CT CHEST HIGH RESOLUTION    CANCELED: CT CHEST HIGH RESOLUTION  3. Current smoker  F17.200     Discussion:  68 year old female current smoker, approximately 55-pack-year history.  CT scan back in February with  innumerable upper lobe predominant groundglass nodules with potential early areas of cavitation.  At the time was treated with antibiotics and discharged from the hospital.  Auto inflammatory work-up at the time was also negative.  She did not undergo bronchoscopy.  She was seen in follow-up with pulmonary function test.  We reviewed these today in the office.  She has a normal ratio and normal FEV1.  She does not have COPD.  CT imaging however does reveal some areas of mild centrilobular emphysema.  She is continuing to smoke at this time.  We discussed the pertinence of smoking cessation.  She is currently been able to decrease herself to a few cigarettes per day.  She is going to attempt quitting soon.  As for the scattered upper lobe nodules review of the CT scan does reveal sparing of the costophrenic angle.  A potential in  the differential diagnosis would include PL CH.  However I think this is unlikely.  Additional smoking-related ILD with early areas of upper lobe groundglass could be possible.  I think a scattered infectious/inflammatory/exposure etiology is likely the most accurate.  No matter the case, I believe the next best step is repeat HRCT imaging of the chest to look for any progression or change of disease as seen in February 2020.  We will have this scheduled and patient to follow-up with [place] after imaging is complete.  As for the continuance of her Symbicort and as needed albuterol.  I think she can continue to do this.  She does not have a true obstructive lung disease but she does appear to have bronchial reactivity with nocturnal wheezing.  She can continue her current inhaler regimen as prescribed.  Greater than 50% of this patient's 60-minute office visit was face-to-face discussing the recommendations and treatment plan as well as review of prior hospitalization medical records, prior chest imaging as well as pulmonary function test today with the patient in the  office.    Current Outpatient Medications:    albuterol (VENTOLIN HFA) 108 (90 Base) MCG/ACT inhaler, Inhale into the lungs every 6 (six) hours as needed for wheezing or shortness of breath., Disp: , Rfl:    aspirin 81 MG tablet, Take 81 mg by mouth daily., Disp: , Rfl:    B Complex-Biotin-FA (B-COMPLEX PO), Take by mouth daily., Disp: , Rfl:    budesonide-formoterol (SYMBICORT) 160-4.5 MCG/ACT inhaler, Inhale 2 puffs into the lungs 2 (two) times daily., Disp: , Rfl:    hydrochlorothiazide (HYDRODIURIL) 25 MG tablet, Take 25 mg by mouth daily., Disp: , Rfl:    hydrocortisone 2.5 % cream, Apply 1 application topically daily as needed for itching., Disp: , Rfl:    Influenza Vac Split High-Dose (FLUZONE HIGH-DOSE IM), Inject into the muscle., Disp: , Rfl:    ketoconazole (NIZORAL) 2 % shampoo, Apply 1 application topically daily as needed for rash., Disp: , Rfl:    latanoprost (XALATAN) 0.005 % ophthalmic solution, Place 1 drop into both eyes at bedtime. , Disp: , Rfl:    lisinopril (PRINIVIL,ZESTRIL) 20 MG tablet, Take 20 mg by mouth daily., Disp: , Rfl:    Multiple Vitamin (MULTIVITAMIN) tablet, Take 1 tablet by mouth daily., Disp: , Rfl:    pneumococcal 13-valent conjugate vaccine (PREVNAR 13) SUSP injection, Inject 0.5 mLs into the muscle once. To be administered by a pharmacist, Disp: , Rfl:    pravastatin (PRAVACHOL) 40 MG tablet, Take 40 mg by mouth daily., Disp: , Rfl:    tiZANidine (ZANAFLEX) 4 MG capsule, Take 4 mg by mouth daily. , Disp: , Rfl:    traMADol (ULTRAM) 50 MG tablet, Take 50 mg by mouth daily as needed for moderate pain. , Disp: , Rfl:    traZODone (DESYREL) 50 MG tablet, Take 50 mg by mouth at bedtime. , Disp: , Rfl:    Garner Nash, DO [facility] 02/12/2019 10:29 AM

## 2019-02-12 ENCOUNTER — Ambulatory Visit: Payer: Medicare Other | Admitting: Pulmonary Disease

## 2019-02-12 ENCOUNTER — Other Ambulatory Visit: Payer: Self-pay

## 2019-02-12 ENCOUNTER — Encounter: Payer: Self-pay | Admitting: Pulmonary Disease

## 2019-02-12 VITALS — BP 130/88 | HR 64 | Temp 97.6°F | Ht 65.5 in | Wt 192.8 lb

## 2019-02-12 DIAGNOSIS — R918 Other nonspecific abnormal finding of lung field: Secondary | ICD-10-CM | POA: Diagnosis not present

## 2019-02-12 DIAGNOSIS — F17200 Nicotine dependence, unspecified, uncomplicated: Secondary | ICD-10-CM | POA: Diagnosis not present

## 2019-02-12 NOTE — Patient Instructions (Signed)
Thank you for visiting Dr. Valeta Harms at [facility]. Today we recommend the following:  Orders Placed This Encounter  Procedures  . CT CHEST HIGH RESOLUTION   Please let us know if you have any questions.   Return in about 3 weeks (around 03/05/2019), or if symptoms worsen or fail to improve, for with Dr. Valeta Harms .    Please do your part to reduce the spread of COVID-19. '

## 2019-03-05 ENCOUNTER — Ambulatory Visit: Payer: Medicare Other | Admitting: Pulmonary Disease

## 2019-03-05 ENCOUNTER — Other Ambulatory Visit: Payer: Self-pay

## 2019-03-05 ENCOUNTER — Encounter: Payer: Self-pay | Admitting: Pulmonary Disease

## 2019-03-05 ENCOUNTER — Ambulatory Visit (INDEPENDENT_AMBULATORY_CARE_PROVIDER_SITE_OTHER)
Admission: RE | Admit: 2019-03-05 | Discharge: 2019-03-05 | Disposition: A | Discharge status: Home / Self Care | Payer: Medicare Other | Source: Ambulatory Visit | Attending: Pulmonary Disease | Admitting: Pulmonary Disease

## 2019-03-05 VITALS — BP 110/78 | HR 84 | Temp 97.3°F | Ht 66.0 in | Wt 193.8 lb

## 2019-03-05 DIAGNOSIS — R918 Other nonspecific abnormal finding of lung field: Secondary | ICD-10-CM

## 2019-03-05 DIAGNOSIS — R062 Wheezing: Secondary | ICD-10-CM | POA: Diagnosis not present

## 2019-03-05 DIAGNOSIS — F17200 Nicotine dependence, unspecified, uncomplicated: Secondary | ICD-10-CM | POA: Diagnosis not present

## 2019-03-05 NOTE — Progress Notes (Addendum)
Synopsis: Referred in August 2020 for lung nodules by Buzzy Han*  Subjective:   PATIENT ID: Sara Powell GENDER: female DOB: 06/17/1951, MRN: [id number]  Chief Complaint  Patient presents with   Follow-up    Here to discuss CT results.     This is a 68 year old female past medical history of diabetes, current smoker.  Presented with originally in February 2020 to [facility] with complaints of right-sided chest rib and upper abdominal pain.  CT imaging was completed negative for PE however revealed multiple small cavitary lesions.  There was innumerable small pulmonary nodules with central cavitation auto inflammatory lab work was completed and patient was discharged to follow-up with pulmonary.,  ANCA negative, sed rate 25, MPO PR-3 antibodies negative, ANA negative, Sjogren's negative, HIV negative, QuantiFERON gold negative, eyelid biopsy in 2017 with granulomatous inflammation and plasma cells.  Also found to have indeterminate liver lesions on CT imaging during previous admission.  Follow-up MRI imaging revealed diagnosis of cavernous hemangioma within the liver.  OV 02/12/2019: Today, patient states that her breathing is much better.  She was told that she had COPD and was started on as needed albuterol and Symbicort.  However pulmonary function tests completed in June 2020 reveals a normal FEV1 FVC ratio, FEV1 of 2.13 L, 109% predicted, normal TLC 89%, DLCO 80%.  She is here today for follow-up and establish care with a pulmonary provider.  She was seen in hospital follow-up after being seen initially in consultation for multiple upper lobe small central cavitating nodules.  She had an auto inflammatory work-up that was negative.  We discussed this hospitalization today in the office.  As for her current respiratory complaints she is doing very well.  She does have occasional nocturnal wheezing.  She does notice that the initiation of the inhalers by her PCP has  improved her symptoms greatly.  She is currently using Symbicort 160, 1 puff twice daily.  She is running into difficulty with cost of this but it is cheaper for her to purchase 3 of them at a time from the pharmacy for a 90-day supply.  She will let us know about her current formulary of inhalers available through her insurance.  OV 03/05/2019: Patient doing well today.  Here for review of her HRCT that was completed earlier this afternoon.  Today in the office we reviewed the images in comparison to her previous CT scan.  The final radiology report has not been completed.  Per my review of images with the patient the nodules are improved.  She does have some subpleural abnormalities.  Which may very well represent atelectasis as they are improved in the prone positioning.  We will wait on final pathology report.  As for her wheezing that she had last time it is improved as well.  Continued on treatment with Symbicort and albuterol.  She may very well have asthma/mild intermittent asthma symptoms.  But I am not yet convinced of this.  She denies fevers chills night sweats weight loss.  Good news, she was able to quit smoking.  And I have encouraged her to continue to stay abstinent    Past Medical History:  Diagnosis Date   Anemia    Arthritis    Colitis    Depression    Diabetes mellitus without complication (HCC)    Pre-diabeti/NO MEDS   Fibroid    uterine   Fibromyalgia    Hypertension    Lactose intolerance    Menorrhagia  Family History  Problem Relation Age of Onset   Diabetes Mother    Hypertension Mother    Hypertension Father    Colon cancer Sister    Diabetes Brother    Colon polyps Sister    Heart disease Maternal Aunt    Heart attack Maternal Aunt    Cancer Maternal Aunt        stomach   Diabetes Paternal Aunt    Diabetes Maternal Grandmother    Stroke Maternal Grandmother    Cancer Maternal Grandfather        colon   Colon cancer  Maternal Grandfather    Diabetes Paternal Grandmother      Past Surgical History:  Procedure Laterality Date   ABDOMINAL HYSTERECTOMY     BREAST BIOPSY Left    COLONOSCOPY     LAPAROSCOPIC SALPINGO OOPHERECTOMY     POLYPECTOMY     ROTATOR CUFF REPAIR     right shoulder   sty  on eyelid     left eye/removed 2 times   TUBAL LIGATION      Social History   Socioeconomic History   Marital status: Married    Spouse name: Not on file   Number of children: Not on file   Years of education: Not on file   Highest education level: Not on file  Occupational History   Not on file  Social Needs   Financial resource strain: Not on file   Food insecurity    Worry: Not on file    Inability: Not on file   Transportation needs    Medical: Not on file    Non-medical: Not on file  Tobacco Use   Smoking status: Current Every Day Smoker    Packs/day: 0.50    Years: 54.00    Pack years: 27.00    Types: Cigarettes    Start date: 08/28/1964   Smokeless tobacco: Never Used   Tobacco comment: 7 per day 08/29/18  Substance and Sexual Activity   Alcohol use: Yes    Alcohol/week: 5.0 standard drinks    Types: 5 Standard drinks or equivalent per week    Comment: wine   Drug use: No   Sexual activity: Not on file  Lifestyle   Physical activity    Days per week: Not on file    Minutes per session: Not on file   Stress: Not on file  Relationships   Social connections    Talks on phone: Not on file    Gets together: Not on file    Attends religious service: Not on file    Active member of club or organization: Not on file    Attends meetings of clubs or organizations: Not on file    Relationship status: Not on file   Intimate partner violence    Fear of current or ex partner: Not on file    Emotionally abused: Not on file    Physically abused: Not on file    Forced sexual activity: Not on file  Other Topics Concern   Not on file  Social History Narrative     Not on file     Allergies  Allergen Reactions   Penicillins Hives and Itching    Did it involve swelling of the face/tongue/throat, SOB, or low BP? No Did it involve sudden or severe rash/hives, skin peeling, or any reaction on the inside of your mouth or nose? Yes Did you need to seek medical attention at a hospital or doctor's office?  No When did it last happen?More than 10 years ago If all above answers are NO, may proceed with cephalosporin use.    Tylox [Oxycodone-Acetaminophen] Hives and Itching     Outpatient Medications Prior to Visit  Medication Sig Dispense Refill   albuterol (VENTOLIN HFA) 108 (90 Base) MCG/ACT inhaler Inhale into the lungs every 6 (six) hours as needed for wheezing or shortness of breath.     aspirin 81 MG tablet Take 81 mg by mouth daily.     B Complex-Biotin-FA (B-COMPLEX PO) Take by mouth daily.     budesonide-formoterol (SYMBICORT) 160-4.5 MCG/ACT inhaler Inhale 2 puffs into the lungs 2 (two) times daily.     hydrochlorothiazide (HYDRODIURIL) 25 MG tablet Take 25 mg by mouth daily.     hydrocortisone 2.5 % cream Apply 1 application topically daily as needed for itching.     Influenza Vac Split High-Dose (FLUZONE HIGH-DOSE IM) Inject into the muscle.     ketoconazole (NIZORAL) 2 % shampoo Apply 1 application topically daily as needed for rash.     latanoprost (XALATAN) 0.005 % ophthalmic solution Place 1 drop into both eyes at bedtime.      lisinopril (PRINIVIL,ZESTRIL) 20 MG tablet Take 20 mg by mouth daily.     Multiple Vitamin (MULTIVITAMIN) tablet Take 1 tablet by mouth daily.     pneumococcal 13-valent conjugate vaccine (PREVNAR 13) SUSP injection Inject 0.5 mLs into the muscle once. To be administered by a pharmacist     pravastatin (PRAVACHOL) 40 MG tablet Take 40 mg by mouth daily.     tiZANidine (ZANAFLEX) 4 MG capsule Take 4 mg by mouth daily.      traMADol (ULTRAM) 50 MG tablet Take 50 mg by mouth daily as needed  for moderate pain.      traZODone (DESYREL) 50 MG tablet Take 50 mg by mouth at bedtime.      No facility-administered medications prior to visit.     Review of Systems  Constitutional: Negative for chills, fever, malaise/fatigue and weight loss.  HENT: Negative for hearing loss, sore throat and tinnitus.   Eyes: Negative for blurred vision and double vision.  Respiratory: Negative for cough, hemoptysis, sputum production, shortness of breath, wheezing and stridor.   Cardiovascular: Negative for chest pain, palpitations, orthopnea, leg swelling and PND.  Gastrointestinal: Negative for abdominal pain, constipation, diarrhea, heartburn, nausea and vomiting.  Genitourinary: Negative for dysuria, hematuria and urgency.  Musculoskeletal: Negative for joint pain and myalgias.  Skin: Negative for itching and rash.  Neurological: Negative for dizziness, tingling, weakness and headaches.  Endo/Heme/Allergies: Negative for environmental allergies. Does not bruise/bleed easily.  Psychiatric/Behavioral: Negative for depression. The patient is not nervous/anxious and does not have insomnia.   All other systems reviewed and are negative.    Objective:  Physical Exam Vitals signs reviewed.  Constitutional:      General: She is not in acute distress.    Appearance: She is well-developed.  HENT:     Head: Normocephalic and atraumatic.  Eyes:     General: No scleral icterus.    Conjunctiva/sclera: Conjunctivae normal.     Pupils: Pupils are equal, round, and reactive to light.  Neck:     Musculoskeletal: Neck supple.     Vascular: No JVD.     Trachea: No tracheal deviation.  Cardiovascular:     Rate and Rhythm: Normal rate and regular rhythm.     Heart sounds: Normal heart sounds. No murmur.  Pulmonary:     Effort:  Pulmonary effort is normal. No tachypnea, accessory muscle usage or respiratory distress.     Breath sounds: Normal breath sounds. No stridor. No wheezing, rhonchi or rales.    Abdominal:     General: Bowel sounds are normal. There is no distension.     Palpations: Abdomen is soft.     Tenderness: There is no abdominal tenderness.  Musculoskeletal:        General: No tenderness.  Lymphadenopathy:     Cervical: No cervical adenopathy.  Skin:    General: Skin is warm and dry.     Capillary Refill: Capillary refill takes less than 2 seconds.     Findings: No rash.  Neurological:     Mental Status: She is alert and oriented to person, place, and time.  Psychiatric:        Behavior: Behavior normal.      Vitals:   03/05/19 1512  BP: 110/78  Pulse: 84  Temp: (!) 97.3 F (36.3 C)  TempSrc: Temporal  SpO2: 95%  Weight: 193 lb 12.8 oz (87.9 kg)  Height: 5\' 6"  (1.676 m)   95% on RA BMI Readings from Last 3 Encounters:  03/05/19 31.28 kg/m  02/12/19 31.60 kg/m  08/29/18 29.63 kg/m   Wt Readings from Last 3 Encounters:  03/05/19 193 lb 12.8 oz (87.9 kg)  02/12/19 192 lb 12.8 oz (87.5 kg)  08/29/18 180 lb 12.8 oz (82 kg)     CBC    Component Value Date/Time   WBC 6.3 08/15/2018 0431   RBC 4.77 08/15/2018 0431   HGB 13.8 08/15/2018 0431   HCT 42.5 08/15/2018 0431   PLT 221 08/15/2018 0431   MCV 89.1 08/15/2018 0431   MCH 28.9 08/15/2018 0431   MCHC 32.5 08/15/2018 0431   RDW 12.7 08/15/2018 0431   LYMPHSABS 2.1 08/13/2018 0332   MONOABS 0.4 08/13/2018 0332   EOSABS 0.1 08/13/2018 0332   BASOSABS 0.0 08/13/2018 0332     Chest Imaging: February 2020 CT chest: Multiple upper lobe predominant groundglass nodules at the end of the vascular bundle.  Some with small early areas of cavitation.  Likely infectious. The patient's images have been independently reviewed by me.    Pulmonary Functions Testing Results: PFT Results Latest Ref Rng & Units 11/28/2018  FVC-Pre L 2.60  FVC-Predicted Pre % 103  FVC-Post L 2.61  FVC-Predicted Post % 104  Pre FEV1/FVC % % 80  Post FEV1/FCV % % 82  FEV1-Pre L 2.08  FEV1-Predicted Pre % 106   FEV1-Post L 2.13  DLCO UNC% % 80  DLCO COR %Predicted % 92  TLC L 4.69  TLC % Predicted % 89  RV % Predicted % 100    FeNO: None   Pathology: None   Echocardiogram: None   Heart Catheterization: None     Assessment & Plan:     ICD-10-CM   1. Multiple lung nodules  R91.8   2. Current smoker  F17.200   3. Abnormal findings on diagnostic imaging of lung  R91.8   4. Wheezing  R06.2     Discussion:  68 year old female, former smoker, approximately 55-pack-year history.  Back in February 2020 she had CT scan with upper lobe predominant groundglass nodules some with early areas of cavitation.  She had a autoimmune work-up which was all negative.  She did not undergo bronchoscopy at the time.  Follow-up CT imaging today reveals improvement in these nodules.  I believe this abnormality that she had in February was related  to her infectious type symptoms that were present at the time and these inflammatory lesions are now resolving.  She does have a few scattered interstitial changes.  Additionally she has evidence of mild centrilobular emphysema.  PFTs with no evidence of COPD.  She does however have nocturnal wheezing on occasion.  I am not sure if this is just reactive airway disease due to her smoking.  She may very well have mild intermittent asthma symptoms but I am not convinced of this.  She can continue use of her Symbicort at this time as well as PRN albuterol.  I will call the patient with the final radiology report.  Patient to return to clinic in 6 months. Greater than 50% of this patient's 25-minute of visit was spent face-to-face discussing the above recommendations and treatment plan.    Current Outpatient Medications:    albuterol (VENTOLIN HFA) 108 (90 Base) MCG/ACT inhaler, Inhale into the lungs every 6 (six) hours as needed for wheezing or shortness of breath., Disp: , Rfl:    aspirin 81 MG tablet, Take 81 mg by mouth daily., Disp: , Rfl:    B  Complex-Biotin-FA (B-COMPLEX PO), Take by mouth daily., Disp: , Rfl:    budesonide-formoterol (SYMBICORT) 160-4.5 MCG/ACT inhaler, Inhale 2 puffs into the lungs 2 (two) times daily., Disp: , Rfl:    hydrochlorothiazide (HYDRODIURIL) 25 MG tablet, Take 25 mg by mouth daily., Disp: , Rfl:    hydrocortisone 2.5 % cream, Apply 1 application topically daily as needed for itching., Disp: , Rfl:    Influenza Vac Split High-Dose (FLUZONE HIGH-DOSE IM), Inject into the muscle., Disp: , Rfl:    ketoconazole (NIZORAL) 2 % shampoo, Apply 1 application topically daily as needed for rash., Disp: , Rfl:    latanoprost (XALATAN) 0.005 % ophthalmic solution, Place 1 drop into both eyes at bedtime. , Disp: , Rfl:    lisinopril (PRINIVIL,ZESTRIL) 20 MG tablet, Take 20 mg by mouth daily., Disp: , Rfl:    Multiple Vitamin (MULTIVITAMIN) tablet, Take 1 tablet by mouth daily., Disp: , Rfl:    pneumococcal 13-valent conjugate vaccine (PREVNAR 13) SUSP injection, Inject 0.5 mLs into the muscle once. To be administered by a pharmacist, Disp: , Rfl:    pravastatin (PRAVACHOL) 40 MG tablet, Take 40 mg by mouth daily., Disp: , Rfl:    tiZANidine (ZANAFLEX) 4 MG capsule, Take 4 mg by mouth daily. , Disp: , Rfl:    traMADol (ULTRAM) 50 MG tablet, Take 50 mg by mouth daily as needed for moderate pain. , Disp: , Rfl:    traZODone (DESYREL) 50 MG tablet, Take 50 mg by mouth at bedtime. , Disp: , Rfl:    Garner Nash, DO [facility] 03/05/2019 3:28 PM    PCCM:  I called spoke with the patient regarding her final radiology results.  Garner Nash, DO [facility] 03/05/2019 5:38 PM

## 2019-03-05 NOTE — Patient Instructions (Addendum)
Thank you for visiting Dr. Valeta Harms at [facility]. Today we recommend the following:  Continue Symbicort and Albuterol  I will call you with the final radiology report and planned follow up  Return in about 6 months (around 09/02/2019).    Please do your part to reduce the spread of COVID-19.

## 2019-03-06 ENCOUNTER — Telehealth: Payer: Self-pay | Admitting: Internal Medicine

## 2019-03-06 NOTE — Telephone Encounter (Signed)
PCCM:  Praveen messaged back in a staff message.  I agree we will plan for image follow up.  Thanks  Garner Nash, DO [facility] 03/06/2019 2:00 PM

## 2019-03-06 NOTE — Telephone Encounter (Signed)
Thank you :)

## 2019-03-06 NOTE — Telephone Encounter (Signed)
Agree with smoking remission + followup CT at appropriate time interval. Praveen: thoughts?

## 2019-03-27 ENCOUNTER — Other Ambulatory Visit: Payer: Self-pay

## 2019-03-27 DIAGNOSIS — Z20822 Contact with and (suspected) exposure to covid-19: Secondary | ICD-10-CM

## 2019-03-28 LAB — NOVEL CORONAVIRUS, NAA: SARS-CoV-2, NAA: NOT DETECTED

## 2019-05-12 ENCOUNTER — Other Ambulatory Visit: Payer: Self-pay | Admitting: Family Medicine

## 2019-05-12 DIAGNOSIS — Z1231 Encounter for screening mammogram for malignant neoplasm of breast: Secondary | ICD-10-CM

## 2019-05-19 ENCOUNTER — Other Ambulatory Visit: Payer: Self-pay | Admitting: Family Medicine

## 2019-05-19 DIAGNOSIS — M8580 Other specified disorders of bone density and structure, unspecified site: Secondary | ICD-10-CM

## 2019-05-22 ENCOUNTER — Ambulatory Visit: Payer: Medicare Other

## 2019-05-23 ENCOUNTER — Ambulatory Visit
Admission: RE | Admit: 2019-05-23 | Discharge: 2019-05-23 | Disposition: A | Discharge status: Home / Self Care | Payer: Medicare Other | Source: Ambulatory Visit | Attending: Family Medicine | Admitting: Family Medicine

## 2019-05-23 ENCOUNTER — Other Ambulatory Visit: Payer: Self-pay

## 2019-05-23 DIAGNOSIS — Z1231 Encounter for screening mammogram for malignant neoplasm of breast: Secondary | ICD-10-CM

## 2020-04-07 ENCOUNTER — Other Ambulatory Visit: Payer: Self-pay | Admitting: Family Medicine

## 2020-04-07 DIAGNOSIS — Z1231 Encounter for screening mammogram for malignant neoplasm of breast: Secondary | ICD-10-CM

## 2020-05-24 ENCOUNTER — Ambulatory Visit
Admission: RE | Admit: 2020-05-24 | Discharge: 2020-05-24 | Disposition: A | Discharge status: Home / Self Care | Payer: Medicare Other | Source: Ambulatory Visit | Attending: Family Medicine | Admitting: Family Medicine

## 2020-05-24 ENCOUNTER — Other Ambulatory Visit: Payer: Self-pay

## 2020-05-24 DIAGNOSIS — Z1231 Encounter for screening mammogram for malignant neoplasm of breast: Secondary | ICD-10-CM

## 2020-06-17 ENCOUNTER — Ambulatory Visit
Admission: RE | Admit: 2020-06-17 | Discharge: 2020-06-17 | Disposition: A | Discharge status: Home / Self Care | Payer: Medicare Other | Source: Ambulatory Visit | Attending: Family Medicine | Admitting: Family Medicine

## 2020-06-17 ENCOUNTER — Other Ambulatory Visit: Payer: Self-pay

## 2020-07-03 ENCOUNTER — Other Ambulatory Visit: Payer: Self-pay | Admitting: Family Medicine

## 2020-07-03 DIAGNOSIS — Z13820 Encounter for screening for osteoporosis: Secondary | ICD-10-CM

## 2020-09-09 ENCOUNTER — Other Ambulatory Visit: Payer: Self-pay | Admitting: Family

## 2020-09-09 DIAGNOSIS — R52 Pain, unspecified: Secondary | ICD-10-CM

## 2020-09-23 ENCOUNTER — Other Ambulatory Visit: Payer: Self-pay | Admitting: Obstetrics & Gynecology

## 2020-09-25 ENCOUNTER — Other Ambulatory Visit: Payer: Self-pay

## 2020-09-25 ENCOUNTER — Ambulatory Visit
Admission: RE | Admit: 2020-09-25 | Discharge: 2020-09-25 | Disposition: A | Discharge status: Home / Self Care | Payer: Medicare Other | Source: Ambulatory Visit | Attending: Family | Admitting: Family

## 2020-09-25 DIAGNOSIS — R52 Pain, unspecified: Secondary | ICD-10-CM

## 2020-10-29 ENCOUNTER — Ambulatory Visit
Admission: RE | Admit: 2020-10-29 | Discharge: 2020-10-29 | Disposition: A | Discharge status: Home / Self Care | Payer: Medicare Other | Source: Ambulatory Visit | Attending: Family Medicine | Admitting: Family Medicine

## 2020-10-29 ENCOUNTER — Other Ambulatory Visit: Payer: Self-pay

## 2020-10-29 DIAGNOSIS — Z13820 Encounter for screening for osteoporosis: Secondary | ICD-10-CM

## 2020-12-16 ENCOUNTER — Encounter: Payer: Self-pay | Admitting: Gastroenterology

## 2020-12-29 ENCOUNTER — Emergency Department (HOSPITAL_COMMUNITY)
Admission: EM | Admit: 2020-12-29 | Discharge: 2020-12-29 | Discharge status: Against Medical Advice | Payer: Medicare Other

## 2020-12-29 ENCOUNTER — Emergency Department (HOSPITAL_COMMUNITY): Payer: Medicare Other

## 2020-12-29 ENCOUNTER — Encounter (HOSPITAL_COMMUNITY): Payer: Self-pay

## 2020-12-29 ENCOUNTER — Emergency Department (HOSPITAL_COMMUNITY)
Admission: EM | Admit: 2020-12-29 | Discharge: 2020-12-29 | Disposition: A | Discharge status: Home / Self Care | Payer: Medicare Other | Attending: Emergency Medicine | Admitting: Emergency Medicine

## 2020-12-29 ENCOUNTER — Other Ambulatory Visit: Payer: Self-pay

## 2020-12-29 DIAGNOSIS — I10 Essential (primary) hypertension: Secondary | ICD-10-CM | POA: Diagnosis not present

## 2020-12-29 DIAGNOSIS — F17210 Nicotine dependence, cigarettes, uncomplicated: Secondary | ICD-10-CM | POA: Diagnosis not present

## 2020-12-29 DIAGNOSIS — E119 Type 2 diabetes mellitus without complications: Secondary | ICD-10-CM | POA: Insufficient documentation

## 2020-12-29 DIAGNOSIS — Z7982 Long term (current) use of aspirin: Secondary | ICD-10-CM | POA: Insufficient documentation

## 2020-12-29 DIAGNOSIS — M25512 Pain in left shoulder: Secondary | ICD-10-CM | POA: Insufficient documentation

## 2020-12-29 DIAGNOSIS — Z79899 Other long term (current) drug therapy: Secondary | ICD-10-CM | POA: Diagnosis not present

## 2020-12-29 MED ORDER — HYDROCODONE-ACETAMINOPHEN 5-325 MG PO TABS
1.0000 | ORAL_TABLET | Freq: Once | ORAL | Status: AC
  Administered 2020-12-29: 1 via ORAL
  Filled 2020-12-29: qty 1

## 2020-12-29 MED ORDER — IBUPROFEN 200 MG PO TABS
400.0000 mg | ORAL_TABLET | Freq: Once | ORAL | Status: AC
  Administered 2020-12-29: 400 mg via ORAL
  Filled 2020-12-29: qty 2

## 2020-12-29 NOTE — ED Triage Notes (Signed)
Pt states that she has torn her left arm rotator cuff. Pain 10/10. Pt states that her right arm was operated on for the same injury a while ago.

## 2020-12-29 NOTE — ED Provider Notes (Signed)
[facility] DEPT Provider Note   CSN: [id number] Arrival date & time: 12/29/20  0202     History Chief Complaint  Patient presents with   Arm Injury    Sara Powell is a 70 y.o. female.  The history is provided by the patient.  Arm Injury Location:  Shoulder Shoulder location:  L shoulder Injury: no   Pain details:    Quality:  Aching   Radiates to:  Does not radiate   Severity:  Moderate   Onset quality:  Gradual   Timing:  Constant   Progression:  Worsening Relieved by:  Nothing Worsened by:  Movement Associated symptoms: no fever   Patient reports she feels that she has a rotator cuff tear to her left shoulder.  She has had this previously and required surgery. 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   Depression 08/13/2018   Hypertension 08/13/2018   Hypokalemia 08/13/2018   Abdominal pain, RUQ (right upper quadrant)    Atypical chest pain    Liver lesion     Past Surgical History:  Procedure Laterality Date   ABDOMINAL HYSTERECTOMY     BREAST BIOPSY Left    COLONOSCOPY     LAPAROSCOPIC SALPINGO OOPHERECTOMY     POLYPECTOMY     ROTATOR CUFF REPAIR     right shoulder   sty  on eyelid     left eye/removed 2 times   TUBAL LIGATION       OB History     Gravida  5   Para  3   Term      Preterm      AB  2   Living  3      SAB  2   IAB      Ectopic      Multiple      Live Births  3            Family History  Problem Relation Age of Onset   Diabetes Mother    Hypertension Mother    Hypertension Father    Colon cancer Sister    Diabetes Brother    Colon polyps Sister    Heart disease Maternal Aunt    Heart attack Maternal Aunt    Cancer Maternal Aunt        stomach   Diabetes Paternal Aunt    Diabetes Maternal Grandmother    Stroke Maternal Grandmother    Cancer Maternal Grandfather        colon   Colon cancer Maternal Grandfather    Diabetes Paternal Grandmother     Social History   Tobacco Use   Smoking status: Every Day    Packs/day: 0.50    Years: 54.00    Pack years: 27.00    Types: Cigarettes    Start date: 08/28/1964   Smokeless tobacco: Never  Tobacco comments:    7 per day 08/29/18  Substance Use Topics   Alcohol use: Yes    Alcohol/week: 5.0 standard drinks    Types: 5 Standard drinks or equivalent per week    Comment: wine   Drug use: No    Home Medications Prior to Admission medications   Medication Sig Start Date End Date Taking? Authorizing Provider  albuterol (VENTOLIN HFA) 108 (90 Base) MCG/ACT inhaler Inhale into the lungs every 6 (six) hours as needed for wheezing or shortness of breath.    [provider]  aspirin 81 MG tablet Take 81 mg by mouth daily.    [provider]  B Complex-Biotin-FA (B-COMPLEX PO) Take by mouth daily.    [provider]  budesonide-formoterol (SYMBICORT) 160-4.5 MCG/ACT inhaler Inhale 2 puffs into the lungs 2 (two) times daily.    [provider]  hydrochlorothiazide (HYDRODIURIL) 25 MG tablet Take 25 mg by mouth daily.    [provider]  hydrocortisone 2.5 % cream Apply 1 application topically daily as needed for itching.    [provider]  Influenza Vac Split High-Dose (FLUZONE HIGH-DOSE IM) Inject into the muscle.    [provider]  ketoconazole (NIZORAL) 2 % shampoo Apply 1 application topically daily as needed for rash.     [provider]  latanoprost (XALATAN) 0.005 % ophthalmic solution Place 1 drop into both eyes at bedtime.     [provider]  lisinopril (PRINIVIL,ZESTRIL) 20 MG tablet Take 20 mg by mouth daily.    [provider]  Multiple Vitamin (MULTIVITAMIN) tablet Take 1 tablet by mouth daily.    [provider]  pneumococcal 13-valent conjugate vaccine (PREVNAR 13) SUSP injection Inject 0.5 mLs into the muscle once. To be administered by a pharmacist    [provider]  pravastatin (PRAVACHOL) 40 MG tablet Take 40 mg by mouth daily. 06/21/18   [provider]  tiZANidine (ZANAFLEX) 4 MG capsule Take 4 mg by mouth daily.     [provider]  traMADol (ULTRAM) 50 MG tablet Take 50 mg by mouth daily as needed for moderate pain.     [provider]  traZODone (DESYREL) 50 MG tablet Take 50 mg by mouth at bedtime.     [provider]    Allergies    Penicillins and Tylox [oxycodone-acetaminophen]  Review of Systems   Review of Systems  Constitutional:  Negative for fever.  Respiratory:  Negative for shortness of breath.   Cardiovascular:  Negative for chest pain.  Musculoskeletal:  Positive for arthralgias. Negative for joint swelling.   Physical Exam Updated Vital Signs BP (!) 185/109   Pulse 72   Temp 99.1 F (37.3 C) (Oral)   Resp 18   Ht 1.676 m (5\' 6" )   Wt 83 kg   SpO2 100%   BMI 29.54 kg/m   Physical Exam CONSTITUTIONAL: Well developed/well nourished HEAD: Normocephalic/atraumatic EYES: EOMI ENMT: Mucous membranes moist NECK: supple no meningeal signs CV: S1/S2 noted, no murmurs/rubs/gallops noted LUNGS: Lungs are clear to auscultation bilaterally, no apparent distress ABDOMEN: soft, nontender NEURO: Pt is awake/alert/appropriate, moves all extremitiesx4.  No facial droop.   EXTREMITIES: pulses normal/equal, full ROM, tenderness over left shoulder but no erythema, no warmth or edema.  She is able to  abduct the shoulder but limited due to pain.  She is able to touch the opposite shoulder without difficulty.  Distal pulses equal intact. No tenderness over clavicle SKIN: warm, color  normal PSYCH: no abnormalities of mood noted, alert and oriented to situation  ED Results / Procedures / Treatments   Labs (all labs ordered are listed, but only abnormal results are displayed) Labs Reviewed - No data to display  EKG None  Radiology DG Shoulder Left  Result Date: 12/29/2020 CLINICAL DATA:  70 year old female with left shoulder pain. EXAM: LEFT SHOULDER - 2+ VIEW COMPARISON:  None FINDINGS: There is no acute fracture or dislocation. Mild degenerative changes of the left shoulder. Slight elevation the left humeral head, likely related to chronic rotator cuff injury. Mild spurring from the anterior humeral head. The soft tissues are unremarkable. IMPRESSION: 1. No acute fracture or dislocation. 2. Mild degenerative changes of the left shoulder. Electronically Signed   By: Anner Crete M.D.   On: 12/29/2020 03:47    Procedures Procedures   Medications Ordered in ED Medications  HYDROcodone-acetaminophen (NORCO/VICODIN) 5-325 MG per tablet 1 tablet (1 tablet Oral Given 12/29/20 0403)  ibuprofen (ADVIL) tablet 400 mg (400 mg Oral Given 12/29/20 0404)    ED Course  I have reviewed the triage vital signs and the nursing notes.  Pertinent  imaging results that were available during my care of the patient were reviewed by me and considered in my medical decision making (see chart for details).    MDM Rules/Calculators/A&P                          X-ray negative.  Patient has no other complaints.  Will place in sling and referred to orthopedics Final Clinical Impression(s) / ED Diagnoses Final diagnoses:  Acute pain of left shoulder    Rx / DC Orders ED Discharge Orders     None        Ripley Fraise, MD 12/29/20 [phone]

## 2021-01-05 ENCOUNTER — Ambulatory Visit (INDEPENDENT_AMBULATORY_CARE_PROVIDER_SITE_OTHER): Payer: Medicare Other | Admitting: Orthopaedic Surgery

## 2021-01-05 ENCOUNTER — Ambulatory Visit: Payer: Self-pay

## 2021-01-05 ENCOUNTER — Encounter: Payer: Self-pay | Admitting: Orthopaedic Surgery

## 2021-01-05 ENCOUNTER — Other Ambulatory Visit: Payer: Self-pay

## 2021-01-05 DIAGNOSIS — M25512 Pain in left shoulder: Secondary | ICD-10-CM | POA: Diagnosis not present

## 2021-01-05 NOTE — Progress Notes (Signed)
Office Visit Note   Patient: Sara Powell           Date of Birth: 07-Apr-1951           MRN: [id number] Visit Date: 01/05/2021              Requested by: Buzzy Han, MD [place],  [place] [address] PCP: Buzzy Han, MD   Assessment & Plan: Visit Diagnoses:  1. Left shoulder pain, unspecified chronicity     Plan: Impression is left shoulder glenohumeral OA flareup.  Based on options discussed that she is very interested in an intra-articular cortisone injection which Dr. Junius Roads performed today.  She will follow-up if she does not feel any improvement from this injection in a couple weeks.  Follow-Up Instructions: Return if symptoms worsen or fail to improve.   Orders:  Orders Placed This Encounter  Procedures   US Guided Needle Placement - No Linked Charges   No orders of the defined types were placed in this encounter.     Procedures: No procedures performed   Clinical Data: No additional findings.   Subjective: Chief Complaint  Patient presents with   Left Shoulder - Pain    Matika is a 70 year old female who comes in for evaluation of left shoulder pain that acutely increased without any injuries.  The pain was so severe she went to the ED on the 13th.  Since then she states that has gotten a little better but not normal yet.  She used to go to a pain clinic for chronic back pain.  She is right-handed.  Denies any radicular symptoms.  She has been taking gabapentin for the pain.   Review of Systems  Constitutional: Negative.   HENT: Negative.    Eyes: Negative.   Respiratory: Negative.    Cardiovascular: Negative.   Endocrine: Negative.   Musculoskeletal: Negative.   Neurological: Negative.   Hematological: Negative.   Psychiatric/Behavioral: Negative.    All other systems reviewed and are negative.   Objective: Vital Signs: There were no vitals taken for this visit.  Physical Exam Vitals and nursing  note reviewed.  Constitutional:      Appearance: She is well-developed.  HENT:     Head: Normocephalic and atraumatic.  Pulmonary:     Effort: Pulmonary effort is normal.  Abdominal:     Palpations: Abdomen is soft.  Musculoskeletal:     Cervical back: Neck supple.  Skin:    General: Skin is warm.     Capillary Refill: Capillary refill takes less than 2 seconds.  Neurological:     Mental Status: She is alert and oriented to person, place, and time.  Psychiatric:        Behavior: Behavior normal.        Thought Content: Thought content normal.        Judgment: Judgment normal.    Ortho Exam Left shoulder shows mildly decreased range of motion.  Manual muscle testing of the rotator cuff is normal.  No significant tenderness of the shoulder. Specialty Comments:  No specialty comments available.  Imaging: US Guided Needle Placement - No Linked Charges  Result Date: 01/05/2021 Ultrasound guided injection is preferred based studies that show increased duration, increased effect, greater accuracy, decreased procedural pain, increased response rate, and decreased cost with ultrasound guided versus blind injection.   Verbal informed consent obtained.  Time-out conducted.  Noted no overlying erythema, induration, or other signs of local infection. Ultrasound-guided left glenohumeral injection: After  sterile prep with Betadine, injected 4 cc 0.25% bupivocaine without epinephrine and 6 mg betamethasone using a 22-gauge spinal needle, passing the needle from posterior approach into the glenohumeral joint.  Injectate seen filling joint capsule.      PMFS History: Patient Active Problem List   Diagnosis Date Noted   Current smoker 08/29/2018   Abnormal finding on lung imaging 08/29/2018   Type 2 diabetes mellitus without complication ([place]) 64/68/0321   Cavitary lung disease 08/13/2018   Depression 08/13/2018   Hypertension 08/13/2018   Hypokalemia 08/13/2018   Abdominal pain, RUQ (right  upper quadrant)    Atypical chest pain    Liver lesion    Past Medical History:  Diagnosis Date   Anemia    Arthritis    Colitis    Depression    Diabetes mellitus without complication ([place])    Pre-diabeti/NO MEDS   Fibroid    uterine   Fibromyalgia    Hypertension    Lactose intolerance    Menorrhagia     Family History  Problem Relation Age of Onset   Diabetes Mother    Hypertension Mother    Hypertension Father    Colon cancer Sister    Diabetes Brother    Colon polyps Sister    Heart disease Maternal Aunt    Heart attack Maternal Aunt    Cancer Maternal Aunt        stomach   Diabetes Paternal Aunt    Diabetes Maternal Grandmother    Stroke Maternal Grandmother    Cancer Maternal Grandfather        colon   Colon cancer Maternal Grandfather    Diabetes Paternal Grandmother     Past Surgical History:  Procedure Laterality Date   ABDOMINAL HYSTERECTOMY     BREAST BIOPSY Left    COLONOSCOPY     LAPAROSCOPIC SALPINGO OOPHERECTOMY     POLYPECTOMY     ROTATOR CUFF REPAIR     right shoulder   sty  on eyelid     left eye/removed 2 times   TUBAL LIGATION     Social History   Occupational History   Not on file  Tobacco Use   Smoking status: Every Day    Packs/day: 0.50    Years: 54.00    Pack years: 27.00    Types: Cigarettes    Start date: 08/28/1964   Smokeless tobacco: Never   Tobacco comments:    7 per day 08/29/18  Substance and Sexual Activity   Alcohol use: Yes    Alcohol/week: 5.0 standard drinks    Types: 5 Standard drinks or equivalent per week    Comment: wine   Drug use: No   Sexual activity: Not on file

## 2021-01-05 NOTE — Progress Notes (Signed)
Subjective: Patient is here for ultrasound-guided intra-articular left glenohumeral injection.    Objective:  Pain with overhead reach.  Procedure: Ultrasound guided injection is preferred based studies that show increased duration, increased effect, greater accuracy, decreased procedural pain, increased response rate, and decreased cost with ultrasound guided versus blind injection.   Verbal informed consent obtained.  Time-out conducted.  Noted no overlying erythema, induration, or other signs of local infection. Ultrasound-guided left glenohumeral injection: After sterile prep with Betadine, injected 4 cc 0.25% bupivocaine without epinephrine and 6 mg betamethasone using a 22-gauge spinal needle, passing the needle from posterior approach into the glenohumeral joint.  Injectate seen filling joint capsule.

## 2021-02-07 ENCOUNTER — Other Ambulatory Visit: Payer: Self-pay | Admitting: Orthopedic Surgery

## 2021-02-07 DIAGNOSIS — M545 Low back pain, unspecified: Secondary | ICD-10-CM

## 2021-02-16 ENCOUNTER — Other Ambulatory Visit: Payer: Self-pay

## 2021-02-16 ENCOUNTER — Ambulatory Visit (AMBULATORY_SURGERY_CENTER): Payer: Medicare Other | Admitting: *Deleted

## 2021-02-16 VITALS — Ht 66.0 in | Wt 190.0 lb

## 2021-02-16 DIAGNOSIS — Z86010 Personal history of colonic polyps: Secondary | ICD-10-CM

## 2021-02-16 DIAGNOSIS — Z800 Family history of malignant neoplasm of digestive organs: Secondary | ICD-10-CM

## 2021-02-16 MED ORDER — NA SULFATE-K SULFATE-MG SULF 17.5-3.13-1.6 GM/177ML PO SOLN: 1.0000 | ORAL | 0 refills | Status: DC

## 2021-02-16 NOTE — Progress Notes (Signed)
Patient's pre-visit was done today over the phone with the patient due to COVID-19 pandemic. Name,DOB and address verified. Insurance verified. Patient denies any allergies to Eggs and Soy. Patient denies any problems with anesthesia/sedation. Patient denies taking diet pills or blood thinners. No home Oxygen. Packet of Prep instructions mailed to patient including a copy of a consent form-pt is aware. Patient understands to call us back with any questions or concerns. Patient is aware of our care-partner policy and 0000000 safety protocol.   EMMI education assigned to the patient for the procedure, sent to [facility].   The patient is COVID-19 vaccinated.

## 2021-02-20 ENCOUNTER — Other Ambulatory Visit: Payer: Self-pay

## 2021-02-20 ENCOUNTER — Ambulatory Visit
Admission: RE | Admit: 2021-02-20 | Discharge: 2021-02-20 | Disposition: A | Discharge status: Home / Self Care | Payer: Medicare Other | Source: Ambulatory Visit | Attending: Orthopedic Surgery | Admitting: Orthopedic Surgery

## 2021-02-20 DIAGNOSIS — M545 Low back pain, unspecified: Secondary | ICD-10-CM

## 2021-03-02 ENCOUNTER — Other Ambulatory Visit: Payer: Self-pay | Admitting: Gastroenterology

## 2021-03-02 ENCOUNTER — Other Ambulatory Visit: Payer: Self-pay

## 2021-03-02 ENCOUNTER — Ambulatory Visit (AMBULATORY_SURGERY_CENTER): Payer: Medicare Other | Admitting: Gastroenterology

## 2021-03-02 ENCOUNTER — Encounter: Payer: Self-pay | Admitting: Gastroenterology

## 2021-03-02 VITALS — BP 133/76 | HR 55 | Temp 96.9°F | Resp 12 | Ht 66.0 in | Wt 190.0 lb

## 2021-03-02 DIAGNOSIS — D123 Benign neoplasm of transverse colon: Secondary | ICD-10-CM

## 2021-03-02 DIAGNOSIS — Z800 Family history of malignant neoplasm of digestive organs: Secondary | ICD-10-CM

## 2021-03-02 DIAGNOSIS — D125 Benign neoplasm of sigmoid colon: Secondary | ICD-10-CM | POA: Diagnosis not present

## 2021-03-02 DIAGNOSIS — D129 Benign neoplasm of anus and anal canal: Secondary | ICD-10-CM

## 2021-03-02 DIAGNOSIS — D128 Benign neoplasm of rectum: Secondary | ICD-10-CM

## 2021-03-02 DIAGNOSIS — D122 Benign neoplasm of ascending colon: Secondary | ICD-10-CM

## 2021-03-02 DIAGNOSIS — Z86010 Personal history of colonic polyps: Secondary | ICD-10-CM | POA: Diagnosis not present

## 2021-03-02 MED ORDER — SODIUM CHLORIDE 0.9 % IV SOLN: 500.0000 mL | INTRAVENOUS | Status: DC

## 2021-03-02 NOTE — Progress Notes (Signed)
Called to room to assist during endoscopic procedure.  Patient ID and intended procedure confirmed with present staff. Received instructions for my participation in the procedure from the performing physician.  

## 2021-03-02 NOTE — Progress Notes (Signed)
PT taken to PACU. Monitors in place. VSS. Report given to RN. 

## 2021-03-02 NOTE — Op Note (Signed)
[facility] Patient Name: [name] Procedure Date: 03/02/2021 1:36 PM MRN: [id number] Endoscopist: Mauri Pole , MD Age: 70 Referring MD:  Date of Birth: [birth year] Gender: Female Account #: [id number] Procedure:                Colonoscopy Indications:              Screening in patient at increased risk: Family                            history of 1st-degree relative with colorectal                            cancer Medicines:                Monitored Anesthesia Care Procedure:                Pre-Anesthesia Assessment:                           - Prior to the procedure, a History and Physical                            was performed, and patient medications and                            allergies were reviewed. The patient's tolerance of                            previous anesthesia was also reviewed. The risks                            and benefits of the procedure and the sedation                            options and risks were discussed with the patient.                            All questions were answered, and informed consent                            was obtained. Prior Anticoagulants: The patient has                            taken no previous anticoagulant or antiplatelet                            agents. ASA Grade Assessment: II - A patient with                            mild systemic disease. After reviewing the risks                            and benefits, the patient was deemed in  satisfactory condition to undergo the procedure.                           After obtaining informed consent, the colonoscope                            was passed under direct vision. Throughout the                            procedure, the patient's blood pressure, pulse, and                            oxygen saturations were monitored continuously. The                            Olympus PCF-H190DL [id number]) Colonoscope was                             introduced through the anus and advanced to the the                            cecum, identified by appendiceal orifice and                            ileocecal valve. The colonoscopy was performed                            without difficulty. The patient tolerated the                            procedure well. The quality of the bowel                            preparation was good. The ileocecal valve,                            appendiceal orifice, and rectum were photographed. Scope In: 1:55:12 PM Scope Out: 2:21:56 PM Scope Withdrawal Time: 0 hours 17 minutes 47 seconds  Total Procedure Duration: 0 hours 26 minutes 44 seconds  Findings:                 The perianal and digital rectal examinations were                            normal.                           Five sessile polyps were found in the rectum,                            sigmoid colon, transverse colon and ascending                            colon. The polyps were 4 to 8 mm in size. These  polyps were removed with a cold snare. Resection                            and retrieval were complete.                           Scattered small and large-mouthed diverticula were                            found in the sigmoid colon, descending colon,                            transverse colon and ascending colon.                           Non-bleeding external and internal hemorrhoids were                            found during retroflexion. The hemorrhoids were                            medium-sized. Complications:            No immediate complications. Estimated Blood Loss:     Estimated blood loss was minimal. Impression:               - Five 4 to 8 mm polyps in the rectum, in the                            sigmoid colon, in the transverse colon and in the                            ascending colon, removed with a cold snare.                            Resected and retrieved.                            - Diverticulosis in the sigmoid colon, in the                            descending colon, in the transverse colon and in                            the ascending colon.                           - Non-bleeding external and internal hemorrhoids. Recommendation:           - Patient has a contact number available for                            emergencies. The signs and symptoms of potential                            delayed complications were discussed  with the                            patient. Return to normal activities tomorrow.                            Written discharge instructions were provided to the                            patient.                           - Resume previous diet.                           - Continue present medications.                           - Await pathology results.                           - Repeat colonoscopy in 3 - 5 years for                            surveillance based on pathology results. Mauri Pole, MD 03/02/2021 2:26:26 PM This report has been signed electronically.

## 2021-03-02 NOTE — Progress Notes (Signed)
[facility] History and Physical   Primary Care Physician:  Buzzy Han, MD   Reason for Procedure:  History of adenomatous colon polyps  Plan:    Surveillance colonoscopy with possible interventions as needed     HPI: Sara Powell is a very pleasant 70 y.o. female here for colonoscopy. Denies any nausea, vomiting, abdominal pain, melena or bright red blood per rectum  The risks and benefits as well as alternatives of endoscopic procedure(s) have been discussed and reviewed. All questions answered. The patient agrees to proceed.    Past Medical History:  Diagnosis Date   Anemia    Arthritis    Colitis    Depression    Diabetes mellitus without complication ([place])    Pre-diabeti/NO MEDS   Emphysema of lung ([place])    Fibroid    uterine   Fibromyalgia    Hypertension    Lactose intolerance    Menorrhagia     Past Surgical History:  Procedure Laterality Date   ABDOMINAL HYSTERECTOMY     BREAST BIOPSY Left    CATARACT EXTRACTION, BILATERAL     COLONOSCOPY  01/19/2016   Dr.Yasmeen Manka   LAPAROSCOPIC SALPINGO OOPHERECTOMY     POLYPECTOMY     ROTATOR CUFF REPAIR     right shoulder   sty  on eyelid     left eye/removed 2 times   TUBAL LIGATION      Prior to Admission medications   Medication Sig Start Date End Date Taking? Authorizing Provider  APPLE CIDER VINEGAR PO Take by mouth. Chewable   Yes [provider]  aspirin 81 MG tablet Take 81 mg by mouth daily.   Yes [provider]  gabapentin (NEURONTIN) 100 MG capsule Take 100 mg by mouth 3 (three) times daily. 02/03/21  Yes [provider]  hydrochlorothiazide (HYDRODIURIL) 25 MG tablet Take 25 mg by mouth daily.   Yes [provider]  latanoprost (XALATAN) 0.005 % ophthalmic solution Place 1 drop into both eyes at bedtime.    Yes [provider]  lisinopril (PRINIVIL,ZESTRIL) 20 MG tablet Take 20 mg by mouth daily.   Yes [provider]  Multiple Vitamin (MULTIVITAMIN) tablet Take 1 tablet by mouth daily.   Yes [provider]  pravastatin (PRAVACHOL) 40 MG tablet Take 40 mg by mouth daily. 06/21/18  Yes [provider]  tiZANidine (ZANAFLEX) 4 MG tablet Take 4 mg by mouth 2 (two) times daily as needed. 10/27/20  Yes [provider]  traZODone (DESYREL) 50 MG tablet Take 50 mg by mouth at bedtime.    Yes [provider]  albuterol (VENTOLIN HFA) 108 (90 Base) MCG/ACT inhaler Inhale into the lungs every 6 (six) hours as needed for wheezing or shortness of breath.    [provider]  budesonide-formoterol (SYMBICORT) 160-4.5 MCG/ACT inhaler Inhale 2 puffs into the lungs 2 (two) times daily. Patient not taking: Reported on 03/02/2021    [provider]    Current Outpatient Medications  Medication Sig Dispense Refill   APPLE CIDER VINEGAR PO Take by mouth. Chewable     aspirin 81 MG tablet Take 81 mg by mouth daily.     gabapentin (NEURONTIN) 100 MG capsule Take 100 mg by mouth 3 (three) times daily.     hydrochlorothiazide (HYDRODIURIL) 25 MG tablet Take 25 mg by mouth daily.     latanoprost (XALATAN) 0.005 % ophthalmic solution Place 1 drop into both eyes at bedtime.      lisinopril (  PRINIVIL,ZESTRIL) 20 MG tablet Take 20 mg by mouth daily.     Multiple Vitamin (MULTIVITAMIN) tablet Take 1 tablet by mouth daily.     pravastatin (PRAVACHOL) 40 MG tablet Take 40 mg by mouth daily.     tiZANidine (ZANAFLEX) 4 MG tablet Take 4 mg by mouth 2 (two) times daily as needed.     traZODone (DESYREL) 50 MG tablet Take 50 mg by mouth at bedtime.      albuterol (VENTOLIN HFA) 108 (90 Base) MCG/ACT inhaler Inhale into the lungs every 6 (six) hours as needed for wheezing or shortness of breath.     budesonide-formoterol (SYMBICORT) 160-4.5 MCG/ACT inhaler Inhale 2 puffs into the lungs 2 (two) times daily. (Patient not taking: Reported on 03/02/2021)     Current Facility-Administered  Medications  Medication Dose Route Frequency Provider Last Rate Last Admin   0.9 %  sodium chloride infusion  500 mL Intravenous Continuous Kalista Laguardia, Venia Minks, MD        Allergies as of 03/02/2021 - Review Complete 03/02/2021  Allergen Reaction Noted   Penicillins Hives and Itching 08/12/2012   Tylox [oxycodone-acetaminophen] Hives and Itching 08/12/2012    Family History  Problem Relation Age of Onset   Diabetes Mother    Hypertension Mother    Hypertension Father    Colon cancer Sister 51   Colon polyps Sister    Diabetes Brother    Heart disease Maternal Aunt    Heart attack Maternal Aunt    Cancer Maternal Aunt        stomach   Diabetes Paternal Aunt    Diabetes Maternal Grandmother    Stroke Maternal Grandmother    Cancer Maternal Grandfather        colon   Colon cancer Maternal Grandfather    Diabetes Paternal Grandmother    Esophageal cancer Neg Hx    Rectal cancer Neg Hx    Stomach cancer Neg Hx     Social History   Socioeconomic History   Marital status: Married    Spouse name: Not on file   Number of children: Not on file   Years of education: Not on file   Highest education level: Not on file  Occupational History   Not on file  Tobacco Use   Smoking status: Every Day    Packs/day: 0.50    Years: 54.00    Pack years: 27.00    Types: Cigarettes    Start date: 08/28/1964   Smokeless tobacco: Never  Vaping Use   Vaping Use: Never used  Substance and Sexual Activity   Alcohol use: Yes    Alcohol/week: 3.0 standard drinks    Types: 3 Glasses of wine per week    Comment: wine   Drug use: No   Sexual activity: Not on file  Other Topics Concern   Not on file  Social History Narrative   Not on file   Social Determinants of Health   Financial Resource Strain: Not on file  Food Insecurity: Not on file  Transportation Needs: Not on file  Physical Activity: Not on file  Stress: Not on file  Social Connections: Not on file  Intimate Partner  Violence: Not on file    Review of Systems:  All other review of systems negative except as mentioned in the HPI.  Physical Exam: Vital signs in last 24 hours: BP 136/75   Pulse 65   Temp (!) 96.9 F (36.1 C) (Skin)   Ht '5\' 6"'$  (1.676 m)  Wt 190 lb (86.2 kg)   SpO2 99%   BMI 30.67 kg/m     General:   Alert, NAD Lungs:  Clear .   Heart:  Regular rate and rhythm Abdomen:  Soft, nontender and nondistended. Neuro/Psych:  Alert and cooperative. Normal mood and affect. A and O x 3  Reviewed labs, radiology imaging, old records and pertinent past GI work up  Patient is appropriate for planned procedure(s) and anesthesia in an ambulatory setting   K. Denzil Magnuson , MD [phone]

## 2021-03-02 NOTE — Patient Instructions (Signed)
Handouts given on hemorrhoids and polyps. Await pathology results. Repeat colonoscopy in 3-5 years for surveillance based on pathology results.    YOU HAD AN ENDOSCOPIC PROCEDURE TODAY AT [facility]:   Refer to the procedure report that was given to you for any specific questions about what was found during the examination.  If the procedure report does not answer your questions, please call your gastroenterologist to clarify.  If you requested that your care partner not be given the details of your procedure findings, then the procedure report has been included in a sealed envelope for you to review at your convenience later.  YOU SHOULD EXPECT: Some feelings of bloating in the abdomen. Passage of more gas than usual.  Walking can help get rid of the air that was put into your GI tract during the procedure and reduce the bloating. If you had a lower endoscopy (such as a colonoscopy or flexible sigmoidoscopy) you may notice spotting of blood in your stool or on the toilet paper. If you underwent a bowel prep for your procedure, you may not have a normal bowel movement for a few days.  Please Note:  You might notice some irritation and congestion in your nose or some drainage.  This is from the oxygen used during your procedure.  There is no need for concern and it should clear up in a day or so.  SYMPTOMS TO REPORT IMMEDIATELY:  Following lower endoscopy (colonoscopy or flexible sigmoidoscopy):  Excessive amounts of blood in the stool  Significant tenderness or worsening of abdominal pains  Swelling of the abdomen that is new, acute  Fever of 100F or higher   For urgent or emergent issues, a gastroenterologist can be reached at any hour by calling [phone]. Do not use MyChart messaging for urgent concerns.    DIET:  We do recommend a small meal at first, but then you may proceed to your regular diet.  Drink plenty of fluids but you should avoid alcoholic beverages  for 24 hours.  ACTIVITY:  You should plan to take it easy for the rest of today and you should NOT DRIVE or use heavy machinery until tomorrow (because of the sedation medicines used during the test).    FOLLOW UP: Our staff will call the number listed on your records 48-72 hours following your procedure to check on you and address any questions or concerns that you may have regarding the information given to you following your procedure. If we do not reach you, we will leave a message.  We will attempt to reach you two times.  During this call, we will ask if you have developed any symptoms of COVID 19. If you develop any symptoms (ie: fever, flu-like symptoms, shortness of breath, cough etc.) before then, please call [phone].  If you test positive for Covid 19 in the 2 weeks post procedure, please call and report this information to [place].    If any biopsies were taken you will be contacted by phone or by letter within the next 1-3 weeks.  Please call us at [phone] if you have not heard about the biopsies in 3 weeks.    SIGNATURES/CONFIDENTIALITY: You and/or your care partner have signed paperwork which will be entered into your electronic medical record.  These signatures attest to the fact that that the information above on your After Visit Summary has been reviewed and is understood.  Full responsibility of the confidentiality of this discharge information lies with  you and/or your care-partner.

## 2021-03-02 NOTE — Progress Notes (Signed)
Pt's states no medical or surgical changes since previsit or office visit. VS assessed by D.T 

## 2021-03-04 ENCOUNTER — Telehealth: Payer: Self-pay | Admitting: *Deleted

## 2021-03-04 NOTE — Telephone Encounter (Signed)
  Follow up Call-  Call back number 03/02/2021  Post procedure Call Back phone  # [phone]  Permission to leave phone message Yes  Some recent data might be hidden     Patient questions:  Do you have a fever, pain , or abdominal swelling? No. Pain Score  0 *  Have you tolerated food without any problems? Yes.    Have you been able to return to your normal activities? Yes.    Do you have any questions about your discharge instructions: Diet   No. Medications  No. Follow up visit  No.  Do you have questions or concerns about your Care? No.  Actions: * If pain score is 4 or above: No action needed, pain <4.  Have you developed a fever since your procedure? no  2.   Have you had an respiratory symptoms (SOB or cough) since your procedure? no  3.   Have you tested positive for COVID 19 since your procedure no  4.   Have you had any family members/close contacts diagnosed with the COVID 19 since your procedure?  no   If yes to any of these questions please route to Joylene John, RN and Joella Prince, RN

## 2021-03-10 ENCOUNTER — Encounter: Payer: Self-pay | Admitting: Gastroenterology

## 2021-05-24 ENCOUNTER — Other Ambulatory Visit: Payer: Self-pay | Admitting: Family Medicine

## 2021-05-24 DIAGNOSIS — Z1231 Encounter for screening mammogram for malignant neoplasm of breast: Secondary | ICD-10-CM

## 2021-06-29 ENCOUNTER — Ambulatory Visit
Admission: RE | Admit: 2021-06-29 | Discharge: 2021-06-29 | Disposition: A | Discharge status: Home / Self Care | Payer: Medicare Other | Source: Ambulatory Visit | Attending: Family Medicine | Admitting: Family Medicine

## 2021-06-29 DIAGNOSIS — Z1231 Encounter for screening mammogram for malignant neoplasm of breast: Secondary | ICD-10-CM

## 2021-10-04 ENCOUNTER — Other Ambulatory Visit: Payer: Self-pay | Admitting: Family Medicine

## 2021-10-04 DIAGNOSIS — R918 Other nonspecific abnormal finding of lung field: Secondary | ICD-10-CM

## 2021-10-04 DIAGNOSIS — F17200 Nicotine dependence, unspecified, uncomplicated: Secondary | ICD-10-CM

## 2021-11-04 ENCOUNTER — Ambulatory Visit
Admission: RE | Admit: 2021-11-04 | Discharge: 2021-11-04 | Disposition: A | Discharge status: Home / Self Care | Payer: Medicare Other | Source: Ambulatory Visit | Attending: Family Medicine | Admitting: Family Medicine

## 2021-11-04 DIAGNOSIS — R918 Other nonspecific abnormal finding of lung field: Secondary | ICD-10-CM

## 2021-11-04 DIAGNOSIS — F17200 Nicotine dependence, unspecified, uncomplicated: Secondary | ICD-10-CM

## 2022-04-13 ENCOUNTER — Other Ambulatory Visit: Payer: Self-pay

## 2022-04-13 ENCOUNTER — Other Ambulatory Visit: Payer: Self-pay | Admitting: Internal Medicine

## 2022-04-13 ENCOUNTER — Other Ambulatory Visit: Payer: Self-pay | Admitting: Radiology

## 2022-04-13 DIAGNOSIS — Z1231 Encounter for screening mammogram for malignant neoplasm of breast: Secondary | ICD-10-CM

## 2022-07-04 ENCOUNTER — Ambulatory Visit
Admission: RE | Admit: 2022-07-04 | Discharge: 2022-07-04 | Disposition: A | Discharge status: Home / Self Care | Payer: Medicare Other | Source: Ambulatory Visit | Attending: Family Medicine | Admitting: Family Medicine

## 2022-07-04 DIAGNOSIS — Z1231 Encounter for screening mammogram for malignant neoplasm of breast: Secondary | ICD-10-CM

## 2022-07-17 ENCOUNTER — Other Ambulatory Visit: Payer: Self-pay | Admitting: Family Medicine

## 2022-07-17 DIAGNOSIS — F17200 Nicotine dependence, unspecified, uncomplicated: Secondary | ICD-10-CM

## 2022-08-01 ENCOUNTER — Ambulatory Visit
Admission: RE | Admit: 2022-08-01 | Discharge: 2022-08-01 | Disposition: A | Discharge status: Home / Self Care | Payer: Medicare Other | Source: Ambulatory Visit | Attending: Family Medicine | Admitting: Family Medicine

## 2022-08-01 ENCOUNTER — Other Ambulatory Visit: Payer: Self-pay | Admitting: Family Medicine

## 2022-08-01 DIAGNOSIS — M79652 Pain in left thigh: Secondary | ICD-10-CM

## 2022-08-11 ENCOUNTER — Ambulatory Visit
Admission: RE | Admit: 2022-08-11 | Discharge: 2022-08-11 | Disposition: A | Discharge status: Home / Self Care | Payer: Medicare Other | Source: Ambulatory Visit | Attending: Family Medicine | Admitting: Family Medicine

## 2022-08-11 DIAGNOSIS — F17200 Nicotine dependence, unspecified, uncomplicated: Secondary | ICD-10-CM

## 2022-08-14 ENCOUNTER — Other Ambulatory Visit (HOSPITAL_BASED_OUTPATIENT_CLINIC_OR_DEPARTMENT_OTHER): Payer: Self-pay

## 2022-08-14 DIAGNOSIS — G478 Other sleep disorders: Secondary | ICD-10-CM

## 2022-08-15 ENCOUNTER — Other Ambulatory Visit: Payer: Self-pay

## 2022-08-15 ENCOUNTER — Encounter: Payer: Self-pay | Admitting: Physical Therapy

## 2022-08-15 ENCOUNTER — Ambulatory Visit: Payer: Medicare Other | Attending: Family Medicine | Admitting: Physical Therapy

## 2022-08-15 DIAGNOSIS — M5459 Other low back pain: Secondary | ICD-10-CM | POA: Diagnosis present

## 2022-08-15 DIAGNOSIS — R262 Difficulty in walking, not elsewhere classified: Secondary | ICD-10-CM

## 2022-08-15 DIAGNOSIS — M6281 Muscle weakness (generalized): Secondary | ICD-10-CM

## 2022-08-15 NOTE — Therapy (Signed)
OUTPATIENT PHYSICAL THERAPY THORACOLUMBAR EVALUATION   Patient Name: Sara Powell MRN: [id number] DOB:12-30-1950, 72 y.o., female Today's Date: 08/15/2022  END OF SESSION:  PT End of Session - 08/15/22 1022     Visit Number 1    Date for PT Re-Evaluation 10/10/22    Authorization Type U[employer] Medicare    PT Start Time 1018    PT Stop Time 1058    PT Time Calculation (min) 40 min    Activity Tolerance Patient tolerated treatment well             Past Medical History:  Diagnosis Date   Anemia    Arthritis    Colitis    Depression    Diabetes mellitus without complication ([place])    Pre-diabeti/NO MEDS   Emphysema of lung ([place])    Fibroid    uterine   Fibromyalgia    Hypertension    Lactose intolerance    Menorrhagia    Past Surgical History:  Procedure Laterality Date   ABDOMINAL HYSTERECTOMY     BREAST BIOPSY Left    CATARACT EXTRACTION, BILATERAL     COLONOSCOPY  01/19/2016   Dr.Nandigam   LAPAROSCOPIC SALPINGO OOPHERECTOMY     POLYPECTOMY     ROTATOR CUFF REPAIR     right shoulder   sty  on eyelid     left eye/removed 2 times   TUBAL LIGATION     Patient Active Problem List   Diagnosis Date Noted   Current smoker 08/29/2018   Abnormal finding on lung imaging 08/29/2018   Type 2 diabetes mellitus without complication ([place]) [id number]   Cavitary lung disease 08/13/2018   Depression 08/13/2018   Hypertension 08/13/2018   Hypokalemia 08/13/2018   Abdominal pain, RUQ (right upper quadrant)    Atypical chest pain    Liver lesion     PCP: Loura Pardon MD  REFERRING PROVIDER: Loura Pardon MD  REFERRING DIAG: back pain, thigh pain, balance/gait training  Rationale for Evaluation and Treatment: Rehabilitation  THERAPY DIAG:  Back pain; weakness; difficulty in walking ONSET DATE: 06/19/22  SUBJECTIVE:                                                                                                                                                                                            SUBJECTIVE STATEMENT: Back pain for a long time, went to a pain clinic and got radiofrequency for a few times that helped a little while.  I lose my balance sometimes.  Lose balance walking into kitchen and have to steady myself on the doorjam.  I'm a night owl go to bed at  1 am and wake up at 3 am not b/c of the pain  PERTINENT HISTORY:  OA right knee; Aortic atherosclerosis, smoker, diabetic angiopathy, emphysema, fibromyalgia, lumbar stenosis, depression "I have memory issues too" PAIN:  PAIN:  Are you having pain? Yes NPRS scale: 5-6/10 Pain location: bil back, right post thigh intermittently, bil groin  Aggravating factors: after walking starts bil hip pain; sitting, rising; standing to bake cakes Relieving factors: copper bracelets help right knee; heat  Max walk 15 min  PRECAUTIONS: Fall  WEIGHT BEARING RESTRICTIONS: No  FALLS:  Has patient fallen in last 6 months? No  LIVING ENVIRONMENT: Lives with: lives with their family with daughter and son Lives in: House/apartment my room is downstairs Stairs: Yes: Internal: flight steps; on right going up and External: 2 steps; none Bedroom downstairs/avoids going upstairs OCCUPATION: retired  PLOF: Independent with basic ADLs  I do my own laundry, keep kitchen organized  PATIENT GOALS: alleviate the pain so I can bake cakes; If I didn't hurt would be going to ballroom dancing "[employer]", they do line dancing they went on cruise last year    OBJECTIVE:   DIAGNOSTIC FINDINGS:  X-rays showed OA right knee, back   PATIENT SURVEYS:  Modified Oswestry 36%   COGNITION: Overall cognitive status: Within functional limits for tasks assessed     LUMBAR ROM:   AROM eval  Flexion Fingers 4 inches from toes  Extension 10  Right lateral flexion 25  Left lateral flexion 25  Right rotation   Left rotation    (Blank rows = not tested)  TRUNK STRENGTH:  Decreased activation  of transverse abdominus muscles; abdominals 4-/5; decreased activation of lumbar multifidi; trunk extensors 4-/5  LOWER EXTREMITY ROM:   WFLs except for hip stiffness with hip rotators and flexors  LOWER EXTREMITY MMT:    MMT Right eval Left eval  Hip flexion 4+ 4  Hip extension    Hip abduction 4 4-  Hip adduction    Hip internal rotation    Hip external rotation    Knee flexion 4+ 4+  Knee extension 4 4-  Ankle dorsiflexion    Ankle plantarflexion    Ankle inversion    Ankle eversion     (Blank rows = not tested)  LUMBAR SPECIAL TESTS:  Slump test: Negative  FUNCTIONAL TESTS:  5x sit to stand: no hands 29.96  GAIT: Comments: 2 min walk 193 feet bil LBP left hip and left side 5-6/10  TODAY'S TREATMENT:                                                                                                                              DATE: 2/27    PATIENT EDUCATION:  Education details: Educated patient on anatomy and physiology of current symptoms, prognosis, plan of care as well as initial self care strategies to promote recovery  Person educated: Patient Education method: Explanation Education comprehension: verbalized understanding  HOME EXERCISE  PROGRAM: To be started  ASSESSMENT:  CLINICAL IMPRESSION: Patient is a 72 y.o. female who was seen today for physical therapy evaluation and treatment for back pain, thigh pain and decreased balance. The patient would benefit from PT to address trunk and hip range of motion deficits, strength asymmetries in lumbo/pelvic and hip regions and pain levels that are currently affecting activities of daily living at home including standing, sleeping, walking, lifting and performing hobbies and recreational activities like dancing.  She reports decreased balance as well but not falls.  This could be related with strength deficits but will assess static and dynamic balance next visit.    OBJECTIVE IMPAIRMENTS: decreased activity  tolerance, difficulty walking, decreased ROM, decreased strength, impaired perceived functional ability, and pain.   ACTIVITY LIMITATIONS: carrying, lifting, bending, standing, squatting, stairs, and locomotion level  PARTICIPATION LIMITATIONS: meal prep, cleaning, shopping, and community activity  PERSONAL FACTORS: time since onset, multi body regions affected and numerous co-morbidities (fibromyalgia, OA, diabetes, emphysema) are also affecting patient's functional outcome.   REHAB POTENTIAL: Good  CLINICAL DECISION MAKING: Evolving/moderate complexity  EVALUATION COMPLEXITY: Moderate   GOALS: Goals reviewed with patient? Yes  SHORT TERM GOALS: Target date: 09/12/2022    The patient will demonstrate knowledge of basic self care strategies and exercises to promote healing   Baseline: Goal status: INITIAL  2.  The patient will report a 30% improvement in pain levels with functional activities which are currently difficult including standing to bake Baseline:  Goal status: INITIAL  3.  The patient will have improved trunk flexor and extensor muscle strength to at least 4/5 needed for lifting medium weight objects such as grocery bags, kitchen bowls/dishes Baseline:  Goal status: INITIAL  4.  The patient will have improved hip strength to at least 4/5 needed for standing, walking longer distances and descending stairs at home and in the community  Baseline:  Goal status: INITIAL  5.  5x sit to stand time improved to 23 sec less Baseline:  Goal status: INITIAL    LONG TERM GOALS: Target date: 10/10/2022    The patient will be independent in a safe self progression of a home exercise program to promote further recovery of function   Baseline:  Goal status: INITIAL  2.  The patient will report a 75% improvement in pain levels with functional activities which are currently difficult including standing to bake, dance and travel Baseline:  Goal status: INITIAL  3.  The  patient will have improved trunk flexor and extensor muscle strength to at least 4+/5 needed for lifting medium weight objects such as  laundry and luggage and prepare for dancing Baseline:  Goal status: INITIAL  4.  6 min walk 600 feet with pain level 4/10 or less Baseline:  Goal status: INITIAL  5.  Modified Oswestry improved to 27% indicating improved function with less pain Baseline:  Goal status: INITIAL    PLAN:  PT FREQUENCY: 2x/week  PT DURATION: 8 weeks  PLANNED INTERVENTIONS: Therapeutic exercises, Therapeutic activity, Neuromuscular re-education, Balance training, Gait training, Patient/Family education, Self Care, Joint mobilization, Aquatic Therapy, Dry Needling, Spinal mobilization, Cryotherapy, Moist heat, Taping, Traction, Ultrasound, Manual therapy, and Re-evaluation.  PLAN FOR NEXT SESSION: do BERG and/or DGI; start HEP: sit to stands, seated core strength, standing hip extension, hip hinging; Nu-step  Ruben Im, PT 08/15/22 10:05 PM Phone: [phone] Fax: [phone]

## 2022-08-28 ENCOUNTER — Ambulatory Visit (HOSPITAL_BASED_OUTPATIENT_CLINIC_OR_DEPARTMENT_OTHER): Payer: Medicare Other | Attending: Family Medicine | Admitting: Internal Medicine

## 2022-08-28 VITALS — Ht 66.0 in | Wt 178.0 lb

## 2022-08-28 DIAGNOSIS — G478 Other sleep disorders: Secondary | ICD-10-CM | POA: Diagnosis present

## 2022-08-28 DIAGNOSIS — G4736 Sleep related hypoventilation in conditions classified elsewhere: Secondary | ICD-10-CM | POA: Insufficient documentation

## 2022-08-28 DIAGNOSIS — G4733 Obstructive sleep apnea (adult) (pediatric): Secondary | ICD-10-CM | POA: Insufficient documentation

## 2022-08-28 DIAGNOSIS — M797 Fibromyalgia: Secondary | ICD-10-CM | POA: Insufficient documentation

## 2022-08-29 ENCOUNTER — Ambulatory Visit: Payer: Medicare Other | Admitting: Physical Therapy

## 2022-08-31 ENCOUNTER — Ambulatory Visit: Payer: Medicare Other | Attending: Family Medicine | Admitting: Physical Therapy

## 2022-08-31 DIAGNOSIS — M5459 Other low back pain: Secondary | ICD-10-CM

## 2022-08-31 DIAGNOSIS — M6281 Muscle weakness (generalized): Secondary | ICD-10-CM

## 2022-08-31 DIAGNOSIS — R262 Difficulty in walking, not elsewhere classified: Secondary | ICD-10-CM | POA: Diagnosis present

## 2022-08-31 NOTE — Therapy (Signed)
OUTPATIENT PHYSICAL THERAPY THORACOLUMBAR PROGRESS NOTE  Patient Name: Sara Powell MRN: [id number] DOB:1950/06/30, 72 y.o., female Today's Date: 08/31/2022  END OF SESSION:  PT End of Session - 08/31/22 0849     Visit Number 2    Date for PT Re-Evaluation 10/10/22    Authorization Type UHC Medicare    PT Start Time 0848    PT Stop Time 0927    PT Time Calculation (min) 39 min    Activity Tolerance Patient tolerated treatment well             Past Medical History:  Diagnosis Date   Anemia    Arthritis    Colitis    Depression    Diabetes mellitus without complication ([place])    Pre-diabeti/NO MEDS   Emphysema of lung ([place])    Fibroid    uterine   Fibromyalgia    Hypertension    Lactose intolerance    Menorrhagia    Past Surgical History:  Procedure Laterality Date   ABDOMINAL HYSTERECTOMY     BREAST BIOPSY Left    CATARACT EXTRACTION, BILATERAL     COLONOSCOPY  01/19/2016   Dr.Nandigam   LAPAROSCOPIC SALPINGO OOPHERECTOMY     POLYPECTOMY     ROTATOR CUFF REPAIR     right shoulder   sty  on eyelid     left eye/removed 2 times   TUBAL LIGATION     Patient Active Problem List   Diagnosis Date Noted   Current smoker 08/29/2018   Abnormal finding on lung imaging 08/29/2018   Type 2 diabetes mellitus without complication ([place]) XX123456   Cavitary lung disease 08/13/2018   Depression 08/13/2018   Hypertension 08/13/2018   Hypokalemia 08/13/2018   Abdominal pain, RUQ (right upper quadrant)    Atypical chest pain    Liver lesion     PCP: Loura Pardon MD  REFERRING PROVIDER: Loura Pardon MD  REFERRING DIAG: back pain, thigh pain, balance/gait training  Rationale for Evaluation and Treatment: Rehabilitation  THERAPY DIAG:  Back pain; weakness; difficulty in walking ONSET DATE: 06/19/22  SUBJECTIVE:                                                                                                                                                                                            SUBJECTIVE STATEMENT:  Left hamstring region pain.  Diagnosed with OA.    PERTINENT HISTORY:  OA right knee; Aortic atherosclerosis, smoker, diabetic angiopathy, emphysema, fibromyalgia, lumbar stenosis, depression "I have memory issues too" PAIN:  PAIN:  Are you having pain? Yes NPRS scale: 5-6/10 Pain location: bil back, right post thigh  intermittently, bil groin  Aggravating factors: after walking starts bil hip pain; sitting, rising; standing to bake cakes Relieving factors: copper bracelets help right knee; heat  Max walk 15 min  PRECAUTIONS: Fall  WEIGHT BEARING RESTRICTIONS: No  FALLS:  Has patient fallen in last 6 months? No  LIVING ENVIRONMENT: Lives with: lives with their family with daughter and son Lives in: House/apartment my room is downstairs Stairs: Yes: Internal: flight steps; on right going up and External: 2 steps; none Bedroom downstairs/avoids going upstairs OCCUPATION: retired  PLOF: Independent with basic ADLs  I do my own laundry, keep kitchen organized  PATIENT GOALS: alleviate the pain so I can bake cakes; If I didn't hurt would be going to ballroom dancing "[employer]", they do line dancing they went on cruise last year    OBJECTIVE:   DIAGNOSTIC FINDINGS:  X-rays showed OA right knee, back   PATIENT SURVEYS:  Modified Oswestry 36%   COGNITION: Overall cognitive status: Within functional limits for tasks assessed     LUMBAR ROM:   AROM eval  Flexion Fingers 4 inches from toes  Extension 10  Right lateral flexion 25  Left lateral flexion 25  Right rotation   Left rotation    (Blank rows = not tested)  TRUNK STRENGTH:  Decreased activation of transverse abdominus muscles; abdominals 4-/5; decreased activation of lumbar multifidi; trunk extensors 4-/5  LOWER EXTREMITY ROM:   WFLs except for hip stiffness with hip rotators and flexors  LOWER EXTREMITY MMT:    MMT Right eval  Left eval  Hip flexion 4+ 4  Hip extension    Hip abduction 4 4-  Hip adduction    Hip internal rotation    Hip external rotation    Knee flexion 4+ 4+  Knee extension 4 4-  Ankle dorsiflexion    Ankle plantarflexion    Ankle inversion    Ankle eversion     (Blank rows = not tested)  LUMBAR SPECIAL TESTS:  Slump test: Negative  FUNCTIONAL TESTS:  5x sit to stand: no hands 29.96  3/14:  Functional gait assessment: 21/30  GAIT: Comments: 2 min walk 193 feet bil LBP left hip and left side 5-6/10  TODAY'S TREATMENT:                                                                                                                              DATE: 3/14: Seated core with 5# weight: chops, hip to hip, Vs, ear to ear 5x each Sit to stand 2x5 Standing hip hinge: fingers slide down thighs 10x, added 5x weight 10x Standing at the railing: hip extension 10x right/left Standing at the railing: push ups 10x Functional Gait assessment    PATIENT EDUCATION:  Education details: Educated patient on anatomy and physiology of current symptoms, prognosis, plan of care as well as initial self care strategies to promote recovery  Person educated: Patient Education method: Explanation Education comprehension: verbalized understanding  HOME EXERCISE PROGRAM: Access Code: [email or web address] URL: [email or web address] Date: 08/31/2022 Prepared by: Ruben Im  Exercises - Seated Diagonal Chop with Medicine Diona Foley  - 1 x daily - 7 x weekly - 5 sets - 5 reps - Sit to Stand  - 2-3 x daily - 7 x weekly - 1 sets - 5 reps - Shoulder T Hinging at Hips  - 2 x daily - 7 x weekly - 1 sets - 10 reps - Standing Hip Hinge  - 2 x daily - 7 x weekly - 1 sets - 10 reps - Push ups on kitchen counter  - 1 x daily - 7 x weekly - 1 sets - 10 reps  ASSESSMENT:  CLINICAL IMPRESSION: Good initial response to exposure to trunk and LE strengthening ex's.  Functional Gait Assessment performed with a score of  21/30 indicating increased fall risk.  No loss of balance although marked change in gait speed with head turns, negotiating obstacles and with backwards walking.   No complaints of increased pain or following treatment session.      OBJECTIVE IMPAIRMENTS: decreased activity tolerance, difficulty walking, decreased ROM, decreased strength, impaired perceived functional ability, and pain.   ACTIVITY LIMITATIONS: carrying, lifting, bending, standing, squatting, stairs, and locomotion level  PARTICIPATION LIMITATIONS: meal prep, cleaning, shopping, and community activity  PERSONAL FACTORS: time since onset, multi body regions affected and numerous co-morbidities (fibromyalgia, OA, diabetes, emphysema) are also affecting patient's functional outcome.   REHAB POTENTIAL: Good  CLINICAL DECISION MAKING: Evolving/moderate complexity  EVALUATION COMPLEXITY: Moderate   GOALS: Goals reviewed with patient? Yes  SHORT TERM GOALS: Target date: 09/12/2022    The patient will demonstrate knowledge of basic self care strategies and exercises to promote healing   Baseline: Goal status: INITIAL  2.  The patient will report a 30% improvement in pain levels with functional activities which are currently difficult including standing to bake Baseline:  Goal status: INITIAL  3.  The patient will have improved trunk flexor and extensor muscle strength to at least 4/5 needed for lifting medium weight objects such as grocery bags, kitchen bowls/dishes Baseline:  Goal status: INITIAL  4.  The patient will have improved hip strength to at least 4/5 needed for standing, walking longer distances and descending stairs at home and in the community  Baseline:  Goal status: INITIAL  5.  5x sit to stand time improved to 23 sec less Baseline:  Goal status: INITIAL    LONG TERM GOALS: Target date: 10/10/2022    The patient will be independent in a safe self progression of a home exercise program to promote  further recovery of function   Baseline:  Goal status: INITIAL  2.  The patient will report a 75% improvement in pain levels with functional activities which are currently difficult including standing to bake, dance and travel Baseline:  Goal status: INITIAL  3.  The patient will have improved trunk flexor and extensor muscle strength to at least 4+/5 needed for lifting medium weight objects such as  laundry and luggage and prepare for dancing Baseline:  Goal status: INITIAL  4.  6 min walk 600 feet with pain level 4/10 or less Baseline:  Goal status: INITIAL  5.  Modified Oswestry improved to 27% indicating improved function with less pain Baseline:  Goal status: INITIAL    PLAN:  PT FREQUENCY: 2x/week  PT DURATION: 8 weeks  PLANNED INTERVENTIONS: Therapeutic exercises, Therapeutic activity, Neuromuscular re-education, Balance training, Gait training, Patient/Family education, Self Care, Joint  mobilization, Aquatic Therapy, Dry Needling, Spinal mobilization, Cryotherapy, Moist heat, Taping, Traction, Ultrasound, Manual therapy, and Re-evaluation.  PLAN FOR NEXT SESSION: review HEP: sit to stands, seated core strength, standing hip extension, hip hinging; start Nu-step; dynamic balance (maintain gait speed);  has KB at home, add to hip hinge;  follow up after trip to [facility], PT 08/31/22 11:31 AM Phone: [phone] Fax: [phone]

## 2022-09-02 DIAGNOSIS — G4733 Obstructive sleep apnea (adult) (pediatric): Secondary | ICD-10-CM

## 2022-09-02 DIAGNOSIS — G478 Other sleep disorders: Secondary | ICD-10-CM | POA: Diagnosis not present

## 2022-09-02 NOTE — Procedures (Signed)
       Patient Name: Sara Powell, Sara Powell Date: 08/28/2022 Gender: Female D.O.B: 03/12/1951 Age (years): 68 Referring Provider: Loura Pardon MD Height (inches): 66 Interpreting Physician: Baird Lyons MD, ABSM Weight (lbs): 178 RPSGT: Jacolyn Reedy BMI: 29 MRN: [id number] Neck Size: 14.50  CLINICAL INFORMATION Sleep Study Type: HST Indication for sleep study: Insomnia with OSA Epworth Sleepiness Score: 1  SLEEP STUDY TECHNIQUE A multi-channel overnight portable sleep study was performed. The channels recorded were: nasal airflow, thoracic respiratory movement, and oxygen saturation with a pulse oximetry. Snoring was also monitored.  MEDICATIONS Patient self administered medications include: none reported.  SLEEP ARCHITECTURE Patient was studied for 340.5 minutes. The sleep efficiency was 99.4 % and the patient was supine for 0%. The arousal index was 0.0 per hour.  RESPIRATORY PARAMETERS The overall AHI was 32.2 per hour, with a central apnea index of 0 per hour. The oxygen nadir was 60% during sleep.  CARDIAC DATA Mean heart rate during sleep was 71.1 bpm.  IMPRESSIONS - Severe obstructive sleep apnea occurred during this study (AHI = 32.2/h). - Oxygen desaturation was noted during this study (Min O2 = 60%, Mean 89%- indicating Nocturnal Hypoxemia). - Patient snored.  DIAGNOSIS - Obstructive Sleep Apnea (G47.33) - Nocturnal Hypoxemia (G47.36)  RECOMMENDATIONS - Suggest in-center CPAP titration sleep study, which would provide insurance documentation if supplemental O2 is also needed.  Otherwise autopap or other options would be based on clinical judgment. - Be careful with alcohol, sedatives and other CNS depressants that may worsen sleep apnea and disrupt normal sleep architecture. - Sleep hygiene should be reviewed to assess factors that may improve sleep quality. - Weight management and regular exercise should be initiated or  continued.  [Electronically signed] 09/02/2022 12:43 PM  Baird Lyons MD, [place], American Board of Sleep Medicine NPI: [id number]                      [place], [facility]  ELECTRONICALLY SIGNED ON:  09/02/2022, 12:35 PM [place] PH: [phone]   FX: [phone] [place]

## 2022-09-12 ENCOUNTER — Encounter: Payer: Medicare Other | Admitting: Physical Therapy

## 2022-09-14 ENCOUNTER — Ambulatory Visit: Payer: Medicare Other | Admitting: Physical Therapy

## 2022-09-19 ENCOUNTER — Ambulatory Visit: Payer: Medicare Other | Attending: Family Medicine | Admitting: Physical Therapy

## 2022-09-19 DIAGNOSIS — R262 Difficulty in walking, not elsewhere classified: Secondary | ICD-10-CM

## 2022-09-19 DIAGNOSIS — M5459 Other low back pain: Secondary | ICD-10-CM | POA: Diagnosis present

## 2022-09-19 DIAGNOSIS — M6281 Muscle weakness (generalized): Secondary | ICD-10-CM | POA: Diagnosis present

## 2022-09-19 NOTE — Therapy (Signed)
OUTPATIENT PHYSICAL THERAPY THORACOLUMBAR PROGRESS NOTE  Patient Name: Sara Powell MRN: [id number] DOB:1951/04/27, 72 y.o., female Today's Date: 09/19/2022  END OF SESSION:  PT End of Session - 09/19/22 1017     Visit Number 3    Date for PT Re-Evaluation 10/10/22    Authorization Type [employer] Medicare    PT Start Time 1017    PT Stop Time 1057    PT Time Calculation (min) 40 min    Activity Tolerance Patient tolerated treatment well             Past Medical History:  Diagnosis Date   Anemia    Arthritis    Colitis    Depression    Diabetes mellitus without complication ([place])    Pre-diabeti/NO MEDS   Emphysema of lung ([place])    Fibroid    uterine   Fibromyalgia    Hypertension    Lactose intolerance    Menorrhagia    Past Surgical History:  Procedure Laterality Date   ABDOMINAL HYSTERECTOMY     BREAST BIOPSY Left    CATARACT EXTRACTION, BILATERAL     COLONOSCOPY  01/19/2016   Dr.Nandigam   LAPAROSCOPIC SALPINGO OOPHERECTOMY     POLYPECTOMY     ROTATOR CUFF REPAIR     right shoulder   sty  on eyelid     left eye/removed 2 times   TUBAL LIGATION     Patient Active Problem List   Diagnosis Date Noted   Current smoker 08/29/2018   Abnormal finding on lung imaging 08/29/2018   Type 2 diabetes mellitus without complication [id number]   Cavitary lung disease 08/13/2018   Depression 08/13/2018   Hypertension 08/13/2018   Hypokalemia 08/13/2018   Abdominal pain, RUQ (right upper quadrant)    Atypical chest pain    Liver lesion     PCP: Loura Pardon MD  REFERRING PROVIDER: Loura Pardon MD  REFERRING DIAG: back pain, thigh pain, balance/gait training  Rationale for Evaluation and Treatment: Rehabilitation  THERAPY DIAG:  Back pain; weakness; difficulty in walking ONSET DATE: 06/19/22  SUBJECTIVE:                                                                                                                                                                                            SUBJECTIVE STATEMENT: I got this homeopathic foam and that helped the pain riding back from [place].  With traveling I haven't been as diligent with the exercises.   PERTINENT HISTORY:  OA right knee; Aortic atherosclerosis, smoker, diabetic angiopathy, emphysema, fibromyalgia, lumbar stenosis, depression "I have memory issues too" PAIN:  PAIN:  Are  you having pain? Yes NPRS scale: 6/10 Pain location: bil back, right post thigh intermittently Aggravating factors: after walking starts bil hip pain; sitting, rising; standing to bake cakes Relieving factors: copper bracelets help right knee; heat  Max walk 15 min  PRECAUTIONS: Fall  WEIGHT BEARING RESTRICTIONS: No  FALLS:  Has patient fallen in last 6 months? No  LIVING ENVIRONMENT: Lives with: lives with their family with daughter and son Lives in: House/apartment my room is downstairs Stairs: Yes: Internal: flight steps; on right going up and External: 2 steps; none Bedroom downstairs/avoids going upstairs OCCUPATION: retired  PLOF: Independent with basic ADLs  I do my own laundry, keep kitchen organized  PATIENT GOALS: alleviate the pain so I can bake cakes; If I didn't hurt would be going to ballroom dancing "[employer]", they do line dancing they went on cruise last year    OBJECTIVE:   DIAGNOSTIC FINDINGS:  X-rays showed OA right knee, back   PATIENT SURVEYS:  Modified Oswestry 36%   COGNITION: Overall cognitive status: Within functional limits for tasks assessed     LUMBAR ROM:   AROM eval  Flexion Fingers 4 inches from toes  Extension 10  Right lateral flexion 25  Left lateral flexion 25  Right rotation   Left rotation    (Blank rows = not tested)  TRUNK STRENGTH:  Decreased activation of transverse abdominus muscles; abdominals 4-/5; decreased activation of lumbar multifidi; trunk extensors 4-/5 4/2:  trunk strength grossly 4/5 LOWER EXTREMITY ROM:   WFLs  except for hip stiffness with hip rotators and flexors  LOWER EXTREMITY MMT:    MMT Right eval Left eval 4/2  Hip flexion 4+ 4 4  Hip extension     Hip abduction 4 4- 4  Hip adduction     Hip internal rotation     Hip external rotation     Knee flexion 4+ 4+ 4  Knee extension 4 4- 4  Ankle dorsiflexion     Ankle plantarflexion     Ankle inversion     Ankle eversion      (Blank rows = not tested)  LUMBAR SPECIAL TESTS:  Slump test: Negative  FUNCTIONAL TESTS:  5x sit to stand: no hands 29.96 4/2:  14.55 sec   3/14:  Functional gait assessment: 21/30  GAIT: Comments: 2 min walk 193 feet bil LBP left hip and left side 5-6/10  TODAY'S TREATMENT:    DATE: 4/2: Nu-Step L1 10 min  5x sit to stand test Seated core with 5# weight: chops x each Sit to stand 2x5 Standing dead lift to knee level 2 5# weights 2 sets of 5 Squats holding 2 5# weights at shoulders 2 sets of 5 with overhead (hard to do on left side secondary to RTC issue)  Circles on floor toe tap holding 2 and 1 5# weight Resisted walk: 10# backward and forward 10# 5 reps each RPE 8/10 but decreased pain at end of session  DATE: 3/14: Seated core with 5# weight: chops, hip to hip, Vs, ear to ear 5x each Sit to stand 2x5 Standing hip hinge: fingers slide down thighs 10x, added 5x weight 10x Standing at the railing: hip extension 10x right/left Standing at the railing: push ups 10x Functional Gait assessment    PATIENT EDUCATION:  Education details: Educated patient on anatomy and physiology of current symptoms, prognosis, plan of care as well as initial self care strategies to promote recovery  Person educated: Patient Education method: Explanation Education comprehension: verbalized understanding  HOME EXERCISE PROGRAM: Access Code: [email or web address] URL:  [email or web address] Date: 08/31/2022 Prepared by: Ruben Im  Exercises - Seated Diagonal Chop with Medicine Diona Foley  - 1 x daily - 7 x weekly - 5 sets - 5 reps - Sit to Stand  - 2-3 x daily - 7 x weekly - 1 sets - 5 reps - Shoulder T Hinging at Hips  - 2 x daily - 7 x weekly - 1 sets - 10 reps - Standing Hip Hinge  - 2 x daily - 7 x weekly - 1 sets - 10 reps - Push ups on kitchen counter  - 1 x daily - 7 x weekly - 1 sets - 10 reps  ASSESSMENT:  CLINICAL IMPRESSION:  Partial STGS met with significant improvement noted with 5x sit to stand test with performance in 1/2 the time compared to  eval day.  She is able to increase exercise intensity today with more standing ex and added weights.  Therapist monitoring response and providing verbal cues for technique to optimize benefit.  She reports decreased pain at end of session.  Majority of STGs met.    OBJECTIVE IMPAIRMENTS: decreased activity tolerance, difficulty walking, decreased ROM, decreased strength, impaired perceived functional ability, and pain.   ACTIVITY LIMITATIONS: carrying, lifting, bending, standing, squatting, stairs, and locomotion level  PARTICIPATION LIMITATIONS: meal prep, cleaning, shopping, and community activity  PERSONAL FACTORS: time since onset, multi body regions affected and numerous co-morbidities (fibromyalgia, OA, diabetes, emphysema) are also affecting patient's functional outcome.   REHAB POTENTIAL: Good  CLINICAL DECISION MAKING: Evolving/moderate complexity  EVALUATION COMPLEXITY: Moderate   GOALS: Goals reviewed with patient? Yes  SHORT TERM GOALS: Target date: 09/12/2022    The patient will demonstrate knowledge of basic self care strategies and exercises to promote healing   Baseline: Goal status: met 4/2  2.  The patient will report a 30% improvement in pain levels with functional activities which are currently difficult including standing to bake Baseline:  Goal status:  ongoing  3.  The patient will have improved trunk flexor and extensor muscle strength to at least 4/5 needed for lifting medium weight objects such as grocery bags, kitchen bowls/dishes Baseline:  Goal status: met 4/2  4.  The patient will have improved hip strength to at least 4/5 needed for standing, walking longer distances and descending stairs at home and in the community  Baseline:  Goal status: met 4/2 5.  5x sit to stand time improved to 23 sec less Baseline:  Goal status: met 4/2   LONG TERM GOALS: Target date: 10/10/2022    The patient will be independent in a safe self progression of a home exercise program to promote further recovery of function   Baseline:  Goal status: INITIAL  2.  The patient will report a 75% improvement in pain levels with functional activities which are currently difficult including standing to bake, dance and travel Baseline:  Goal status: INITIAL  3.  The patient will have improved trunk flexor and extensor muscle strength to at least 4+/5 needed for lifting medium weight objects such as  laundry and luggage and prepare for dancing Baseline:  Goal status: INITIAL  4.  6 min walk 600 feet with pain level 4/10 or less Baseline:  Goal status: INITIAL  5.  Modified Oswestry improved to 27% indicating improved function with less pain Baseline:  Goal status: INITIAL    PLAN:  PT FREQUENCY: 2x/week  PT DURATION: 8 weeks  PLANNED INTERVENTIONS: Therapeutic exercises, Therapeutic activity, Neuromuscular re-education, Balance training, Gait training, Patient/Family education, Self Care, Joint mobilization, Aquatic Therapy, Dry Needling, Spinal mobilization, Cryotherapy, Moist heat, Taping, Traction, Ultrasound, Manual therapy, and Re-evaluation.  PLAN FOR NEXT SESSION: sit to stands, seated core strength, standing hip extension, hip hinging; start Nu-step; dynamic balance (maintain gait speed);  has KB at home, add to hip hinge  Ruben Im, PT 09/19/22 3:28 PM Phone: [phone] Fax: [phone]

## 2022-09-21 ENCOUNTER — Ambulatory Visit: Payer: Medicare Other | Admitting: Physical Therapy

## 2022-09-21 DIAGNOSIS — R262 Difficulty in walking, not elsewhere classified: Secondary | ICD-10-CM

## 2022-09-21 DIAGNOSIS — M5459 Other low back pain: Secondary | ICD-10-CM | POA: Diagnosis not present

## 2022-09-21 DIAGNOSIS — M6281 Muscle weakness (generalized): Secondary | ICD-10-CM

## 2022-09-21 NOTE — Therapy (Signed)
OUTPATIENT PHYSICAL THERAPY THORACOLUMBAR PROGRESS NOTE  Patient Name: Sara Powell MRN: [id number] DOB:05/01/1951, 72 y.o., female Today's Date: 09/21/2022  END OF SESSION:  PT End of Session - 09/21/22 1030     Visit Number 4    Date for PT Re-Evaluation 10/10/22    Authorization Type [employer] Medicare    PT Start Time [id number]   late   PT Stop Time 1058    PT Time Calculation (min) 30 min    Activity Tolerance Patient tolerated treatment well             Past Medical History:  Diagnosis Date   Anemia    Arthritis    Colitis    Depression    Diabetes mellitus without complication ([place])    Pre-diabeti/NO MEDS   Emphysema of lung ([place])    Fibroid    uterine   Fibromyalgia    Hypertension    Lactose intolerance    Menorrhagia    Past Surgical History:  Procedure Laterality Date   ABDOMINAL HYSTERECTOMY     BREAST BIOPSY Left    CATARACT EXTRACTION, BILATERAL     COLONOSCOPY  01/19/2016   Dr.Nandigam   LAPAROSCOPIC SALPINGO OOPHERECTOMY     POLYPECTOMY     ROTATOR CUFF REPAIR     right shoulder   sty  on eyelid     left eye/removed 2 times   TUBAL LIGATION     Patient Active Problem List   Diagnosis Date Noted   Current smoker 08/29/2018   Abnormal finding on lung imaging 08/29/2018   Type 2 diabetes mellitus without complication [id number]   Cavitary lung disease 08/13/2018   Depression 08/13/2018   Hypertension 08/13/2018   Hypokalemia 08/13/2018   Abdominal pain, RUQ (right upper quadrant)    Atypical chest pain    Liver lesion     PCP: Loura Pardon MD  REFERRING PROVIDER: Loura Pardon MD  REFERRING DIAG: back pain, thigh pain, balance/gait training  Rationale for Evaluation and Treatment: Rehabilitation  THERAPY DIAG:  Back pain; weakness; difficulty in walking ONSET DATE: 06/19/22  SUBJECTIVE:                                                                                                                                                                                            SUBJECTIVE STATEMENT: I had some muscle soreness after last time but that's OK.  PERTINENT HISTORY:  OA right knee; Aortic atherosclerosis, smoker, diabetic angiopathy, emphysema, fibromyalgia, lumbar stenosis, depression "I have memory issues too" PAIN:  PAIN:  Are you having pain? Yes NPRS scale: 3/10 Pain location: bil back, right post  thigh intermittently Aggravating factors: after walking starts bil hip pain; sitting, rising; standing to bake cakes Relieving factors: copper bracelets help right knee; heat  Max walk 15 min  PRECAUTIONS: Fall  WEIGHT BEARING RESTRICTIONS: No  FALLS:  Has patient fallen in last 6 months? No  LIVING ENVIRONMENT: Lives with: lives with their family with daughter and son Lives in: House/apartment my room is downstairs Stairs: Yes: Internal: flight steps; on right going up and External: 2 steps; none Bedroom downstairs/avoids going upstairs OCCUPATION: retired  PLOF: Independent with basic ADLs  I do my own laundry, keep kitchen organized  PATIENT GOALS: alleviate the pain so I can bake cakes; If I didn't hurt would be going to ballroom dancing "[employer]", they do line dancing they went on cruise last year    OBJECTIVE:   DIAGNOSTIC FINDINGS:  X-rays showed OA right knee, back   PATIENT SURVEYS:  Modified Oswestry 36%   COGNITION: Overall cognitive status: Within functional limits for tasks assessed     LUMBAR ROM:   AROM eval 4/4  Flexion Fingers 4 inches from toes   Extension 10   Right lateral flexion 25   Left lateral flexion 25   Right rotation    Left rotation     (Blank rows = not tested)  TRUNK STRENGTH:  Decreased activation of transverse abdominus muscles; abdominals 4-/5; decreased activation of lumbar multifidi; trunk extensors 4-/5 4/2:  trunk strength grossly 4/5 LOWER EXTREMITY ROM:   WFLs except for hip stiffness with hip rotators and flexors  LOWER  EXTREMITY MMT:    MMT Right eval Left eval 4/2  Hip flexion 4+ 4 4  Hip extension     Hip abduction 4 4- 4  Hip adduction     Hip internal rotation     Hip external rotation     Knee flexion 4+ 4+ 4  Knee extension 4 4- 4  Ankle dorsiflexion     Ankle plantarflexion     Ankle inversion     Ankle eversion      (Blank rows = not tested)  LUMBAR SPECIAL TESTS:  Slump test: Negative  FUNCTIONAL TESTS:  5x sit to stand: no hands 29.96 4/2:  14.55 sec   3/14:  Functional gait assessment: 21/30  GAIT: Comments: 2 min walk 193 feet bil LBP left hip and left side 5-6/10  TODAY'S TREATMENT:    DATE: 4/4: Nu-Step L3 5 min  2nd step hip flexor stretch 5x right/left 5# kettlebell snatch and press overhead 8x right/left  10# kettlebell dead lift to tall cone 10x Standing lat bar 35# 15x Squats in front of 20 inch cube holding 10# kettlebell  goblet style 3x5 Counter push ups 10x Counter top hip extension 5x right/left Resisted walk: 10# backward and forward 10# 5 reps each    DATE: 4/2: Nu-Step L1 10 min  5x sit to stand test Seated core with 5# weight: chops x each Sit to stand 2x5 Standing dead lift to knee level 2 5# weights 2 sets of 5 Squats holding 2 5# weights at shoulders 2 sets of 5 with overhead (hard to do on left side secondary to RTC issue)  Circles on floor toe tap holding 2 and 1 5# weight Resisted walk: 10# backward and forward 10# 5 reps each RPE 8/10 but decreased pain at end of session  DATE: 3/14: Seated core with 5# weight: chops, hip to hip, Vs, ear to ear 5x each Sit to stand 2x5 Standing hip hinge: fingers slide down thighs 10x, added 5x weight 10x Standing at the railing: hip extension 10x right/left Standing at the railing: push ups 10x Functional Gait assessment    PATIENT EDUCATION:  Education details: Educated patient  on anatomy and physiology of current symptoms, prognosis, plan of care as well as initial self care strategies to promote recovery  Person educated: Patient Education method: Explanation Education comprehension: verbalized understanding  HOME EXERCISE PROGRAM: Access Code: [email or web address] URL: [email or web address] Date: 08/31/2022 Prepared by: Ruben Im  Exercises - Seated Diagonal Chop with Medicine Diona Foley  - 1 x daily - 7 x weekly - 5 sets - 5 reps - Sit to Stand  - 2-3 x daily - 7 x weekly - 1 sets - 5 reps - Shoulder T Hinging at Hips  - 2 x daily - 7 x weekly - 1 sets - 10 reps - Standing Hip Hinge  - 2 x daily - 7 x weekly - 1 sets - 10 reps - Push ups on kitchen counter  - 1 x daily - 7 x weekly - 1 sets - 10 reps  ASSESSMENT:  CLINICAL IMPRESSION: Patient is able to continue with a steady progression of functional strengthening ex's.  Min cues for head up with dead lifting to decrease spinal flexion.  Improved speed and confidence with resisted backwards and forward walking.  No episodes of loss of balance during treatment session.  Pain level remains low during and at the end of session.   OBJECTIVE IMPAIRMENTS: decreased activity tolerance, difficulty walking, decreased ROM, decreased strength, impaired perceived functional ability, and pain.   ACTIVITY LIMITATIONS: carrying, lifting, bending, standing, squatting, stairs, and locomotion level  PARTICIPATION LIMITATIONS: meal prep, cleaning, shopping, and community activity  PERSONAL FACTORS: time since onset, multi body regions affected and numerous co-morbidities (fibromyalgia, OA, diabetes, emphysema) are also affecting patient's functional outcome.   REHAB POTENTIAL: Good  CLINICAL DECISION MAKING: Evolving/moderate complexity  EVALUATION COMPLEXITY: Moderate   GOALS: Goals reviewed with patient? Yes  SHORT TERM GOALS: Target date: 09/12/2022    The patient will demonstrate knowledge of basic self  care strategies and exercises to promote healing   Baseline: Goal status: met 4/2  2.  The patient will report a 30% improvement in pain levels with functional activities which are currently difficult including standing to bake Baseline:  Goal status: ongoing  3.  The patient will have improved trunk flexor and extensor muscle strength to at least 4/5 needed for lifting medium weight objects such as grocery bags, kitchen bowls/dishes Baseline:  Goal status: met 4/2  4.  The patient will have improved hip strength to at least 4/5 needed for standing, walking longer distances and descending stairs at home and in the community  Baseline:  Goal status: met 4/2 5.  5x sit to stand time improved to 23 sec less Baseline:  Goal status: met 4/2   LONG TERM GOALS: Target date: 10/10/2022    The patient will be independent in a safe self progression of a home exercise program to promote further recovery of function   Baseline:  Goal status: INITIAL  2.  The patient will report a 75% improvement in pain levels with functional activities which are currently difficult including standing to bake, dance and travel Baseline:  Goal status: INITIAL  3.  The patient will have improved trunk flexor and extensor  muscle strength to at least 4+/5 needed for lifting medium weight objects such as  laundry and luggage and prepare for dancing Baseline:  Goal status: INITIAL  4.  6 min walk 600 feet with pain level 4/10 or less Baseline:  Goal status: INITIAL  5.  Modified Oswestry improved to 27% indicating improved function with less pain Baseline:  Goal status: INITIAL    PLAN:  PT FREQUENCY: 2x/week  PT DURATION: 8 weeks  PLANNED INTERVENTIONS: Therapeutic exercises, Therapeutic activity, Neuromuscular re-education, Balance training, Gait training, Patient/Family education, Self Care, Joint mobilization, Aquatic Therapy, Dry Needling, Spinal mobilization, Cryotherapy, Moist heat, Taping,  Traction, Ultrasound, Manual therapy, and Re-evaluation.  PLAN FOR NEXT SESSION:  core strength, standing hip extension, dead lifting and squat progression; Nu-step; dynamic balance (maintain gait speed);  has 10# KB at home; inc weight with resisted walking  Ruben Im, PT 09/21/22 3:00 PM Phone: [phone] Fax: [phone]

## 2022-09-26 ENCOUNTER — Ambulatory Visit: Payer: Medicare Other | Admitting: Physical Therapy

## 2022-09-28 ENCOUNTER — Ambulatory Visit: Payer: Medicare Other | Admitting: Physical Therapy

## 2022-09-28 DIAGNOSIS — R262 Difficulty in walking, not elsewhere classified: Secondary | ICD-10-CM

## 2022-09-28 DIAGNOSIS — M5459 Other low back pain: Secondary | ICD-10-CM

## 2022-09-28 DIAGNOSIS — M6281 Muscle weakness (generalized): Secondary | ICD-10-CM

## 2022-09-28 NOTE — Therapy (Signed)
OUTPATIENT PHYSICAL THERAPY THORACOLUMBAR PROGRESS NOTE  Patient Name: Sara Powell MRN: [id number] DOB:January 03, 1951, 72 y.o., female Today's Date: 09/28/2022  END OF SESSION:  PT End of Session - 09/28/22 1015     Visit Number 5    Date for PT Re-Evaluation 10/10/22    Authorization Type [employer] Medicare    PT Start Time 1013    PT Stop Time 1051    PT Time Calculation (min) 38 min    Activity Tolerance Patient tolerated treatment well             Past Medical History:  Diagnosis Date   Anemia    Arthritis    Colitis    Depression    Diabetes mellitus without complication (HCC)    Pre-diabeti/NO MEDS   Emphysema of lung (HCC)    Fibroid    uterine   Fibromyalgia    Hypertension    Lactose intolerance    Menorrhagia    Past Surgical History:  Procedure Laterality Date   ABDOMINAL HYSTERECTOMY     BREAST BIOPSY Left    CATARACT EXTRACTION, BILATERAL     COLONOSCOPY  01/19/2016   Dr.Nandigam   LAPAROSCOPIC SALPINGO OOPHERECTOMY     POLYPECTOMY     ROTATOR CUFF REPAIR     right shoulder   sty  on eyelid     left eye/removed 2 times   TUBAL LIGATION     Patient Active Problem List   Diagnosis Date Noted   Current smoker 08/29/2018   Abnormal finding on lung imaging 08/29/2018   Type 2 diabetes mellitus without complication 08/15/2018   Cavitary lung disease 08/13/2018   Depression 08/13/2018   Hypertension 08/13/2018   Hypokalemia 08/13/2018   Abdominal pain, RUQ (right upper quadrant)    Atypical chest pain    Liver lesion     PCP: Sharmon Revere MD  REFERRING PROVIDER: Sharmon Revere MD  REFERRING DIAG: back pain, thigh pain, balance/gait training  Rationale for Evaluation and Treatment: Rehabilitation  THERAPY DIAG:  Back pain; weakness; difficulty in walking ONSET DATE: 06/19/22  SUBJECTIVE:                                                                                                                                                                                            SUBJECTIVE STATEMENT: Hamstring pain always when I first get up.  Back pain with standing to cook.   PERTINENT HISTORY:  OA right knee; Aortic atherosclerosis, smoker, diabetic angiopathy, emphysema, fibromyalgia, lumbar stenosis, depression "I have memory issues too" PAIN:  PAIN:  Are you having pain?yes NPRS scale: 6/10 Pain location: hamstring area  left  Aggravating factors: after walking starts bil hip pain; sitting, rising; standing to bake cakes Relieving factors: copper bracelets help right knee; heat; nerve foam my aunt told me about  Max walk 15 min  PRECAUTIONS: Fall  WEIGHT BEARING RESTRICTIONS: No  FALLS:  Has patient fallen in last 6 months? No  LIVING ENVIRONMENT: Lives with: lives with their family with daughter and son Lives in: House/apartment my room is downstairs Stairs: Yes: Internal: flight steps; on right going up and External: 2 steps; none Bedroom downstairs/avoids going upstairs OCCUPATION: retired  PLOF: Independent with basic ADLs  I do my own laundry, keep kitchen organized  PATIENT GOALS: alleviate the pain so I can bake cakes; If I didn't hurt would be going to ballroom dancing "[employer]", they do line dancing they went on cruise last year    OBJECTIVE:   DIAGNOSTIC FINDINGS:  X-rays showed OA right knee, back   PATIENT SURVEYS:  Modified Oswestry 36%   COGNITION: Overall cognitive status: Within functional limits for tasks assessed     LUMBAR ROM:   AROM eval 4/4  Flexion Fingers 4 inches from toes WNLs  Extension 10 15  Right lateral flexion 25   Left lateral flexion 25   Right rotation    Left rotation     (Blank rows = not tested)  TRUNK STRENGTH:  Decreased activation of transverse abdominus muscles; abdominals 4-/5; decreased activation of lumbar multifidi; trunk extensors 4-/5 4/2:  trunk strength grossly 4/5 LOWER EXTREMITY ROM:   WFLs except for hip stiffness with hip  rotators and flexors  LOWER EXTREMITY MMT:    MMT Right eval Left eval 4/2  Hip flexion 4+ 4 4  Hip extension     Hip abduction 4 4- 4  Hip adduction     Hip internal rotation     Hip external rotation     Knee flexion 4+ 4+ 4  Knee extension 4 4- 4  Ankle dorsiflexion     Ankle plantarflexion     Ankle inversion     Ankle eversion      (Blank rows = not tested)  LUMBAR SPECIAL TESTS:  Slump test: Negative  FUNCTIONAL TESTS:  5x sit to stand: no hands 29.96 4/2:  14.55 sec   3/14:  Functional gait assessment: 21/30  GAIT: Comments: 2 min walk 193 feet bil LBP left hip and left side 5-6/10  TODAY'S TREATMENT:   DATE: 4/11: Nu-Step L1 6 min while discussing status 2nd step HS stretch 5x right/left Standing ITB/QL stretch reach up and over 5x right/left  6 inch step ups no hand support 10x right/left  Pink power loop dead lifts 10x  Green band shoulder punches 10x 2 right/left 30# barbell dead lift from 20 inch height 12x 10# cable pulley resisted side stepping 4x each direction Lat bar 35# 15x RPE 8-9/10         DATE: 4/4: Nu-Step L3 5 min  2nd step hip flexor stretch 5x right/left 5# kettlebell snatch and press overhead 8x right/left  10# kettlebell dead lift to tall cone 10x Standing lat bar 35# 15x Squats in front of 20 inch cube holding 10# kettlebell  goblet style 3x5 Counter push ups 10x Counter top hip extension 5x right/left Resisted walk: 10# backward and forward 10# 5 reps each    DATE: 4/2: Nu-Step L1 10 min  5x sit to stand test Seated core with 5# weight: chops x each Sit to stand 2x5 Standing dead lift to  knee level 2 5# weights 2 sets of 5 Squats holding 2 5# weights at shoulders 2 sets of 5 with overhead (hard to do on left side secondary to RTC issue)  Circles on floor toe tap holding 2 and 1 5# weight Resisted walk: 10# backward and forward 10# 5 reps each RPE 8/10 but decreased pain at end of session           PATIENT  EDUCATION:  Education details: Educated patient on anatomy and physiology of current symptoms, prognosis, plan of care as well as initial self care strategies to promote recovery  Person educated: Patient Education method: Explanation Education comprehension: verbalized understanding  HOME EXERCISE PROGRAM: Access Code: [email or web address] URL: [email or web address] Date: 08/31/2022 Prepared by: Lavinia Sharps  Exercises - Seated Diagonal Chop with Medicine Newman Pies  - 1 x daily - 7 x weekly - 5 sets - 5 reps - Sit to Stand  - 2-3 x daily - 7 x weekly - 1 sets - 5 reps - Shoulder T Hinging at Hips  - 2 x daily - 7 x weekly - 1 sets - 10 reps - Standing Hip Hinge  - 2 x daily - 7 x weekly - 1 sets - 10 reps - Push ups on kitchen counter  - 1 x daily - 7 x weekly - 1 sets - 10 reps  ASSESSMENT:  CLINICAL IMPRESSION: Some back pain with dead lifting but tolerable in intensity. Verbal cues to widen stance and forward gaze.  Hamstring pain present on arrival relieved by end of session.  Much improved dynamic balance noted.  Patient requests to decrease visit frequency secondary to financial reasons.     OBJECTIVE IMPAIRMENTS: decreased activity tolerance, difficulty walking, decreased ROM, decreased strength, impaired perceived functional ability, and pain.   ACTIVITY LIMITATIONS: carrying, lifting, bending, standing, squatting, stairs, and locomotion level  PARTICIPATION LIMITATIONS: meal prep, cleaning, shopping, and community activity  PERSONAL FACTORS: time since onset, multi body regions affected and numerous co-morbidities (fibromyalgia, OA, diabetes, emphysema) are also affecting patient's functional outcome.   REHAB POTENTIAL: Good  CLINICAL DECISION MAKING: Evolving/moderate complexity  EVALUATION COMPLEXITY: Moderate   GOALS: Goals reviewed with patient? Yes  SHORT TERM GOALS: Target date: 09/12/2022    The patient will demonstrate knowledge of basic self care  strategies and exercises to promote healing   Baseline: Goal status: met 4/2  2.  The patient will report a 30% improvement in pain levels with functional activities which are currently difficult including standing to bake Baseline:  Goal status: met 4/11  3.  The patient will have improved trunk flexor and extensor muscle strength to at least 4/5 needed for lifting medium weight objects such as grocery bags, kitchen bowls/dishes Baseline:  Goal status: met 4/2  4.  The patient will have improved hip strength to at least 4/5 needed for standing, walking longer distances and descending stairs at home and in the community  Baseline:  Goal status: met 4/2 5.  5x sit to stand time improved to 23 sec less Baseline:  Goal status: met 4/2   LONG TERM GOALS: Target date: 10/10/2022    The patient will be independent in a safe self progression of a home exercise program to promote further recovery of function   Baseline:  Goal status: INITIAL  2.  The patient will report a 75% improvement in pain levels with functional activities which are currently difficult including standing to bake, dance and travel Baseline:  Goal status: INITIAL  3.  The patient will have improved trunk flexor and extensor muscle strength to at least 4+/5 needed for lifting medium weight objects such as  laundry and luggage and prepare for dancing Baseline:  Goal status: INITIAL  4.  6 min walk 600 feet with pain level 4/10 or less Baseline:  Goal status: INITIAL  5.  Modified Oswestry improved to 27% indicating improved function with less pain Baseline:  Goal status: INITIAL    PLAN:  PT FREQUENCY: 2x/week  PT DURATION: 8 weeks  PLANNED INTERVENTIONS: Therapeutic exercises, Therapeutic activity, Neuromuscular re-education, Balance training, Gait training, Patient/Family education, Self Care, Joint mobilization, Aquatic Therapy, Dry Needling, Spinal mobilization, Cryotherapy, Moist heat, Taping,  Traction, Ultrasound, Manual therapy, and Re-evaluation.  PLAN FOR NEXT SESSION:  ERO next visit: decrease treatment frequency to 1x/week; Oswestry; 6 min walk test; 5x sit to stand; functional gait assessment;  core strength, standing hip extension, dead lifting and squat progression; Nu-step; dynamic balance (maintain gait speed);  has 10# KB at home; inc weight with resisted walking  Lavinia SharpsStacy Raigan Baria, PT 09/28/22 12:26 PM Phone: [phone] Fax: [phone]

## 2022-10-03 ENCOUNTER — Encounter: Payer: Medicare Other | Admitting: Physical Therapy

## 2022-10-05 ENCOUNTER — Ambulatory Visit: Payer: Medicare Other | Admitting: Physical Therapy

## 2022-10-05 DIAGNOSIS — M6281 Muscle weakness (generalized): Secondary | ICD-10-CM

## 2022-10-05 DIAGNOSIS — M5459 Other low back pain: Secondary | ICD-10-CM | POA: Diagnosis not present

## 2022-10-05 DIAGNOSIS — R262 Difficulty in walking, not elsewhere classified: Secondary | ICD-10-CM

## 2022-10-05 NOTE — Therapy (Addendum)
 OUTPATIENT PHYSICAL THERAPY THORACOLUMBAR PROGRESS NOTE/RECERTIFICATION  Patient Name: Sara Powell MRN: [id number] DOB:10-Nov-1950, 71 y.o., female Today's Date: 10/05/2022  END OF SESSION:  PT End of Session - 10/05/22 1015     Visit Number 6    Date for PT Re-Evaluation 11/30/22    Authorization Type [employer] Medicare    PT Start Time 1016    PT Stop Time 1058    PT Time Calculation (min) 42 min    Activity Tolerance Patient tolerated treatment well             Past Medical History:  Diagnosis Date   Anemia    Arthritis    Colitis    Depression    Diabetes mellitus without complication (HCC)    Pre-diabeti/NO MEDS   Emphysema of lung (HCC)    Fibroid    uterine   Fibromyalgia    Hypertension    Lactose intolerance    Menorrhagia    Past Surgical History:  Procedure Laterality Date   ABDOMINAL HYSTERECTOMY     BREAST BIOPSY Left    CATARACT EXTRACTION, BILATERAL     COLONOSCOPY  01/19/2016   Dr.Nandigam   LAPAROSCOPIC SALPINGO OOPHERECTOMY     POLYPECTOMY     ROTATOR CUFF REPAIR     right shoulder   sty  on eyelid     left eye/removed 2 times   TUBAL LIGATION     Patient Active Problem List   Diagnosis Date Noted   Current smoker 08/29/2018   Abnormal finding on lung imaging 08/29/2018   Type 2 diabetes mellitus without complication 08/15/2018   Cavitary lung disease 08/13/2018   Depression 08/13/2018   Hypertension 08/13/2018   Hypokalemia 08/13/2018   Abdominal pain, RUQ (right upper quadrant)    Atypical chest pain    Liver lesion     PCP: Sharmon Revere MD  REFERRING PROVIDER: Sharmon Revere MD  REFERRING DIAG: back pain, thigh pain, balance/gait training  Rationale for Evaluation and Treatment: Rehabilitation  THERAPY DIAG:  Back pain; weakness; difficulty in walking ONSET DATE: 06/19/22  SUBJECTIVE:                                                                                                                                                                                            SUBJECTIVE STATEMENT: Been doing some yardwork.  I really enjoy it. Some stiffness all over from working in the yard.  60-75% better self rated.     PERTINENT HISTORY:  OA right knee; Aortic atherosclerosis, smoker, diabetic angiopathy, emphysema, fibromyalgia, lumbar stenosis, depression "I have memory issues too" Enjoys gardening PAIN:  PAIN:  Are you having pain?yes NPRS scale: 3/10 Pain location: "all over" Aggravating factors: after walking starts bil hip pain; sitting, rising; standing to bake cakes Relieving factors: copper bracelets help right knee; heat; nerve foam my aunt told me about  Max walk 15 min  PRECAUTIONS: Fall  WEIGHT BEARING RESTRICTIONS: No  FALLS:  Has patient fallen in last 6 months? No  LIVING ENVIRONMENT: Lives with: lives with their family with daughter and son Lives in: House/apartment my room is downstairs Stairs: Yes: Internal: flight steps; on right going up and External: 2 steps; none Bedroom downstairs/avoids going upstairs OCCUPATION: retired  PLOF: Independent with basic ADLs  I do my own laundry, keep kitchen organized  PATIENT GOALS: alleviate the pain so I can bake cakes; If I didn't hurt would be going to ballroom dancing "[employer]", they do line dancing they went on cruise last year    OBJECTIVE:   DIAGNOSTIC FINDINGS:  X-rays showed OA right knee, back   PATIENT SURVEYS:  Modified Oswestry 36%  4/18:  28%   COGNITION: Overall cognitive status: Within functional limits for tasks assessed     LUMBAR ROM:   AROM eval 4/4 4/18  Flexion Fingers 4 inches from toes WNLs WNL  Extension 10 15 20   Right lateral flexion 25  30  Left lateral flexion 25  30  Right rotation     Left rotation      (Blank rows = not tested)  TRUNK STRENGTH:  Decreased activation of transverse abdominus muscles; abdominals 4-/5; decreased activation of lumbar multifidi; trunk  extensors 4-/5 4/2:  trunk strength grossly 4/5 4/18:  trunk strength grossly 4/5  LOWER EXTREMITY ROM:   WFLs except for hip stiffness with hip rotators and flexors  LOWER EXTREMITY MMT:    MMT Right eval Left eval 4/2 4/18  Hip flexion 4+ 4 4 4+  Hip extension      Hip abduction 4 4- 4 4 bil   Hip adduction      Hip internal rotation      Hip external rotation      Knee flexion 4+ 4+ 4 4+ bil  Knee extension 4 4- 4 4 bil  Ankle dorsiflexion      Ankle plantarflexion      Ankle inversion      Ankle eversion       (Blank rows = not tested)  LUMBAR SPECIAL TESTS:  Slump test: Negative  FUNCTIONAL TESTS:  5x sit to stand: no hands 29.96 4/2:  14.55 sec  4/18:  15.26 sec no hands  3/14:  Functional gait assessment: 21/30 4/18:  FGA: 29/30 GAIT: Comments: 2 min walk 193 feet bil LBP left hip and left side 5-6/10 4/18:  2 min walk: 388 feet     3 min walk test: 625 hip pain 4/10 TODAY'S TREATMENT:   DATE: 4/18: Nu-Step L3  7 min while discussing status Lumbar ROM Modified Oswestry  FGA 5x sit to stand 3 min walk test Review of progress toward goals    DATE: 4/11: Nu-Step L1 6 min while discussing status 2nd step HS stretch 5x right/left Standing ITB/QL stretch reach up and over 5x right/left  6 inch step ups no hand support 10x right/left  Pink power loop dead lifts 10x  Green band shoulder punches 10x 2 right/left 30# barbell dead lift from 20 inch height 12x 10# cable pulley resisted side stepping 4x each direction Lat bar 35# 15x RPE 8-9/10  DATE: 4/4: Nu-Step L3 5 min  2nd step hip flexor stretch 5x right/left 5# kettlebell snatch and press overhead 8x right/left  10# kettlebell dead lift to tall cone 10x Standing lat bar 35# 15x Squats in front of 20 inch cube holding 10# kettlebell  goblet style 3x5 Counter push ups 10x Counter top hip extension 5x right/left Resisted walk: 10# backward and forward 10# 5 reps each              PATIENT EDUCATION:  Education details: Educated patient on anatomy and physiology of current symptoms, prognosis, plan of care as well as initial self care strategies to promote recovery  Person educated: Patient Education method: Explanation Education comprehension: verbalized understanding  HOME EXERCISE PROGRAM: Access Code: [email or web address] URL: [email or web address] Date: 08/31/2022 Prepared by: Lavinia Sharps  Exercises - Seated Diagonal Chop with Medicine Newman Pies  - 1 x daily - 7 x weekly - 5 sets - 5 reps - Sit to Stand  - 2-3 x daily - 7 x weekly - 1 sets - 5 reps - Shoulder T Hinging at Hips  - 2 x daily - 7 x weekly - 1 sets - 10 reps - Standing Hip Hinge  - 2 x daily - 7 x weekly - 1 sets - 10 reps - Push ups on kitchen counter  - 1 x daily - 7 x weekly - 1 sets - 10 reps  ASSESSMENT:  CLINICAL IMPRESSION: The patient is progressing well with pain reduction, functional measures including dynamic balance with the Functional Gait Assessment and gait speed with 2 and 3 min walk tests.   She has some continued trunk and LE strength deficits needed for goals to dance and do yard work which she really enjoys.  The patient would benefit from a continuation of skilled PT for a further progression of strengthening and functional mobility.  Will continue to update and promote independence in a HEP needed for a return to the highest functional level possible with ADLs.      OBJECTIVE IMPAIRMENTS: decreased activity tolerance, difficulty walking, decreased ROM, decreased strength, impaired perceived functional ability, and pain.   ACTIVITY LIMITATIONS: carrying, lifting, bending, standing, squatting, stairs, and locomotion level  PARTICIPATION LIMITATIONS: meal prep, cleaning, shopping, and community activity  PERSONAL FACTORS: time since onset, multi body regions affected and numerous co-morbidities (fibromyalgia, OA, diabetes, emphysema) are also affecting patient's functional  outcome.   REHAB POTENTIAL: Good  CLINICAL DECISION MAKING: Evolving/moderate complexity  EVALUATION COMPLEXITY: Moderate   GOALS: Goals reviewed with patient? Yes  SHORT TERM GOALS: Target date: 09/12/2022    The patient will demonstrate knowledge of basic self care strategies and exercises to promote healing   Baseline: Goal status: met 4/2  2.  The patient will report a 30% improvement in pain levels with functional activities which are currently difficult including standing to bake Baseline:  Goal status: met 4/11  3.  The patient will have improved trunk flexor and extensor muscle strength to at least 4/5 needed for lifting medium weight objects such as grocery bags, kitchen bowls/dishes Baseline:  Goal status: met 4/2  4.  The patient will have improved hip strength to at least 4/5 needed for standing, walking longer distances and descending stairs at home and in the community  Baseline:  Goal status: met 4/2 5.  5x sit to stand time improved to 23 sec less Baseline:  Goal status: met 4/2   LONG TERM GOALS: Target date: 11/30/2022    The patient will be  independent in a safe self progression of a home exercise program to promote further recovery of function   Baseline:  Goal status: ongoing 2.  The patient will report a 75-80% improvement in pain levels with functional activities which are currently difficult including standing to bake, dance and gardening Baseline:  Goal status: revised  3.  The patient will have improved trunk flexor and extensor muscle strength to at least 4+/5 needed for lifting medium weight objects such as  laundry and luggage and prepare for dancing Baseline:  Goal status: ongoing 4.  6 min walk 800 feet with pain level 4/10 or less Baseline:  Goal status: revised  5.  Modified Oswestry improved to 27% indicating improved function with less pain Baseline:  Goal status: ongoing    PLAN:  PT FREQUENCY: 1x/week  PT DURATION: 8  weeks  PLANNED INTERVENTIONS: Therapeutic exercises, Therapeutic activity, Neuromuscular re-education, Balance training, Gait training, Patient/Family education, Self Care, Joint mobilization, Aquatic Therapy, Dry Needling, Spinal mobilization, Cryotherapy, Moist heat, Taping, Traction, Ultrasound, Manual therapy, and Re-evaluation.  PLAN FOR NEXT SESSION:  decrease treatment frequency to 1x/week; core strength, standing hip extension, dead lifting and squat progression; Nu-step; dynamic balance (maintain gait speed);  has 10# KB at home; inc weight with resisted walking; may 24th party with dancing  Lavinia Sharps, PT 10/05/22 10:51 AM Phone: [phone] Fax: [phone]  PHYSICAL THERAPY DISCHARGE SUMMARY  Visits from Start of Care: 6  Current functional level related to goals / functional outcomes: Pt did not return for follow up   Remaining deficits: As above   Education / Equipment: HEP  Patient goals were partially met. Patient is being discharged due to not returning since the last visit.  Lavinia Sharps, PT 09/13/23 9:25 AM Phone: [phone] Fax: [phone]

## 2022-10-10 ENCOUNTER — Ambulatory Visit: Payer: Medicare Other | Admitting: Physical Therapy

## 2022-10-18 ENCOUNTER — Ambulatory Visit: Payer: Medicare Other | Admitting: Physical Therapy

## 2022-10-25 ENCOUNTER — Other Ambulatory Visit (HOSPITAL_BASED_OUTPATIENT_CLINIC_OR_DEPARTMENT_OTHER): Payer: Self-pay

## 2022-10-25 DIAGNOSIS — G4733 Obstructive sleep apnea (adult) (pediatric): Secondary | ICD-10-CM

## 2022-11-16 ENCOUNTER — Other Ambulatory Visit: Payer: Self-pay | Admitting: Family Medicine

## 2022-11-16 DIAGNOSIS — R109 Unspecified abdominal pain: Secondary | ICD-10-CM

## 2022-11-22 ENCOUNTER — Ambulatory Visit (HOSPITAL_BASED_OUTPATIENT_CLINIC_OR_DEPARTMENT_OTHER): Payer: Medicare Other | Attending: Family Medicine | Admitting: Internal Medicine

## 2022-11-22 ENCOUNTER — Encounter (HOSPITAL_BASED_OUTPATIENT_CLINIC_OR_DEPARTMENT_OTHER): Payer: Self-pay | Admitting: Internal Medicine

## 2022-11-22 VITALS — Ht 66.0 in | Wt 173.0 lb

## 2022-11-22 DIAGNOSIS — G4733 Obstructive sleep apnea (adult) (pediatric): Secondary | ICD-10-CM

## 2022-11-22 DIAGNOSIS — I493 Ventricular premature depolarization: Secondary | ICD-10-CM | POA: Diagnosis not present

## 2022-11-22 HISTORY — DX: Obstructive sleep apnea (adult) (pediatric): G47.33

## 2022-12-03 DIAGNOSIS — G4733 Obstructive sleep apnea (adult) (pediatric): Secondary | ICD-10-CM | POA: Diagnosis not present

## 2022-12-03 NOTE — Procedures (Signed)
Patient Name: Sara Powell, Sara Powell Date: 11/22/2022 Gender: Female D.O.B: 1951-05-26 Age (years): 62 Referring Provider: Sharmon Revere MD Height (inches): 66 Interpreting Physician: Jetty Duhamel MD, ABSM Weight (lbs): 173 RPSGT: Shelah Lewandowsky BMI: 28 MRN: [id number] Neck Size: 14.50  CLINICAL INFORMATION The patient is referred for a CPAP titration to treat sleep apnea.  Date of NPSG, Split Night or HST:    HST 08/28/22- AHI 32.2/ hr, desaturation to 60%/ Mean 89%, body weight 178 lbs                           SLEEP STUDY TECHNIQUE As per the AASM Manual for the Scoring of Sleep and Associated Events v2.3 (April 2016) with a hypopnea requiring 4% desaturations.  The channels recorded and monitored were frontal, central and occipital EEG, electrooculogram (EOG), submentalis EMG (chin), nasal and oral airflow, thoracic and abdominal wall motion, anterior tibialis EMG, snore microphone, electrocardiogram, and pulse oximetry. Continuous positive airway pressure (CPAP) was initiated at the beginning of the study and titrated to treat sleep-disordered breathing.  MEDICATIONS Medications self-administered by patient taken the night of the study : FLONASE, GABAPENTIN, LATANOPROST, PRAVASTATIN, SYMBICORT, TIZANIDINE, TRAZODONE  TECHNICIAN COMMENTS Comments added by technician: NONE Comments added by scorer: N/A RESPIRATORY PARAMETERS Optimal PAP Pressure (cm): 14 AHI at Optimal Pressure (/hr): 1 Overall Minimal O2 (%): 88.0 Supine % at Optimal Pressure (%): 100 Minimal O2 at Optimal Pressure (%): 91.0   SLEEP ARCHITECTURE The study was initiated at 10:59:16 PM and ended at 5:41:47 AM.  Sleep onset time was 31.3 minutes and the sleep efficiency was 85.2%. The total sleep time was 343 minutes.  The patient spent 10.3% of the night in stage N1 sleep, 77.4% in stage N2 sleep, 0.0% in stage N3 and 12.2% in REM.Stage REM latency was 107.5 minutes  Wake after sleep onset was 28.2.  Alpha intrusion was absent. Supine sleep was 33.38%.  CARDIAC DATA The 2 lead EKG demonstrated sinus rhythm. The mean heart rate was 63.6 beats per minute. Other EKG findings include: PVCs.  LEG MOVEMENT DATA The total Periodic Limb Movements of Sleep (PLMS) were 0. The PLMS index was 0.0. A PLMS index of <15 is considered normal in adults.  IMPRESSIONS - The optimal PAP pressure was 14 cm of water. - Central sleep apnea was not noted during this titration (CAI = 1.6/h). - Oxygen desaturations were observed during this titration (min O2 = 88.0%). Minimum O2 saturation on CPAP 14 was 91%. - The patient snored with moderate snoring volume during this titration study. - 2-lead EKG demonstrated: PVCs - Clinically significant periodic limb movements were not noted during this study. Arousals associated with PLMs were rare.  DIAGNOSIS - Obstructive Sleep Apnea (G47.33)  RECOMMENDATIONS - Trial of CPAP therapy on 14 cm H2O or autopap 10-20. - Patient wore a Large size Fisher[employer]Paykel Full Face Evora Full mask and heated humidification. - Be careful with alcohol, sedatives and other CNS depressants that may worsen sleep apnea and disrupt normal sleep architecture. - Sleep hygiene should be reviewed to assess factors that may improve sleep quality. - Weight management and regular exercise should be initiated or continued.  [Electronically signed] 12/03/2022 11:33 AM  Jetty Duhamel MD, ABSM Diplomate, American Board of Sleep Medicine NPI: [id number]                          Jetty Duhamel Diplomate, American Board of Sleep Medicine  ELECTRONICALLY SIGNED ON:  12/03/2022, 11:27 AM [facility] PH: [phone]   FX: [phone] ACCREDITED BY THE AMERICAN ACADEMY OF SLEEP MEDICINE

## 2022-12-15 ENCOUNTER — Ambulatory Visit
Admission: RE | Admit: 2022-12-15 | Discharge: 2022-12-15 | Disposition: A | Discharge status: Home / Self Care | Payer: Medicare Other | Source: Ambulatory Visit | Attending: Family Medicine | Admitting: Family Medicine

## 2022-12-15 DIAGNOSIS — R109 Unspecified abdominal pain: Secondary | ICD-10-CM

## 2022-12-15 MED ORDER — IOPAMIDOL (ISOVUE-300) INJECTION 61%
100.0000 mL | Freq: Once | INTRAVENOUS | Status: AC | PRN
  Administered 2022-12-15: 100 mL via INTRAVENOUS

## 2023-08-21 ENCOUNTER — Other Ambulatory Visit: Payer: Self-pay | Admitting: Family Medicine

## 2023-08-21 DIAGNOSIS — Z1231 Encounter for screening mammogram for malignant neoplasm of breast: Secondary | ICD-10-CM

## 2023-08-22 ENCOUNTER — Other Ambulatory Visit: Payer: Self-pay | Admitting: Family Medicine

## 2023-08-22 DIAGNOSIS — N644 Mastodynia: Secondary | ICD-10-CM

## 2023-08-24 ENCOUNTER — Ambulatory Visit
Admission: RE | Admit: 2023-08-24 | Discharge: 2023-08-24 | Disposition: A | Discharge status: Home / Self Care | Source: Ambulatory Visit | Attending: Family Medicine | Admitting: Family Medicine

## 2023-08-24 DIAGNOSIS — N644 Mastodynia: Secondary | ICD-10-CM

## 2023-09-06 ENCOUNTER — Other Ambulatory Visit: Payer: Self-pay | Admitting: Family Medicine

## 2023-09-06 DIAGNOSIS — F17200 Nicotine dependence, unspecified, uncomplicated: Secondary | ICD-10-CM

## 2023-09-06 DIAGNOSIS — Z122 Encounter for screening for malignant neoplasm of respiratory organs: Secondary | ICD-10-CM

## 2023-09-28 ENCOUNTER — Ambulatory Visit
Admission: RE | Admit: 2023-09-28 | Discharge: 2023-09-28 | Disposition: A | Discharge status: Home / Self Care | Source: Ambulatory Visit | Attending: Family Medicine | Admitting: Family Medicine

## 2023-09-28 DIAGNOSIS — Z122 Encounter for screening for malignant neoplasm of respiratory organs: Secondary | ICD-10-CM

## 2023-09-28 DIAGNOSIS — F17200 Nicotine dependence, unspecified, uncomplicated: Secondary | ICD-10-CM

## 2023-11-13 ENCOUNTER — Other Ambulatory Visit (HOSPITAL_COMMUNITY): Payer: Self-pay | Admitting: Family Medicine

## 2023-11-13 DIAGNOSIS — D491 Neoplasm of unspecified behavior of respiratory system: Secondary | ICD-10-CM

## 2023-11-29 ENCOUNTER — Ambulatory Visit (HOSPITAL_COMMUNITY)
Admission: RE | Admit: 2023-11-29 | Discharge: 2023-11-29 | Disposition: A | Discharge status: Home / Self Care | Source: Ambulatory Visit | Attending: Family Medicine | Admitting: Family Medicine

## 2023-11-29 DIAGNOSIS — N200 Calculus of kidney: Secondary | ICD-10-CM | POA: Insufficient documentation

## 2023-11-29 DIAGNOSIS — I700 Atherosclerosis of aorta: Secondary | ICD-10-CM | POA: Insufficient documentation

## 2023-11-29 DIAGNOSIS — R918 Other nonspecific abnormal finding of lung field: Secondary | ICD-10-CM | POA: Insufficient documentation

## 2023-11-29 DIAGNOSIS — D3502 Benign neoplasm of left adrenal gland: Secondary | ICD-10-CM | POA: Diagnosis not present

## 2023-11-29 DIAGNOSIS — D491 Neoplasm of unspecified behavior of respiratory system: Secondary | ICD-10-CM | POA: Insufficient documentation

## 2023-11-29 DIAGNOSIS — M47816 Spondylosis without myelopathy or radiculopathy, lumbar region: Secondary | ICD-10-CM | POA: Insufficient documentation

## 2023-11-29 DIAGNOSIS — J439 Emphysema, unspecified: Secondary | ICD-10-CM | POA: Insufficient documentation

## 2023-11-29 LAB — GLUCOSE, CAPILLARY: Glucose-Capillary: 130 mg/dL — ABNORMAL HIGH (ref 70–99)

## 2023-11-29 MED ORDER — FLUDEOXYGLUCOSE F - 18 (FDG) INJECTION
7.7500 | Freq: Once | INTRAVENOUS | Status: AC
  Administered 2023-11-29: 7.75 via INTRAVENOUS

## 2023-12-20 ENCOUNTER — Encounter (INDEPENDENT_AMBULATORY_CARE_PROVIDER_SITE_OTHER): Payer: Self-pay

## 2023-12-20 ENCOUNTER — Ambulatory Visit (HOSPITAL_BASED_OUTPATIENT_CLINIC_OR_DEPARTMENT_OTHER): Admitting: Pulmonary Disease

## 2023-12-20 ENCOUNTER — Encounter (HOSPITAL_BASED_OUTPATIENT_CLINIC_OR_DEPARTMENT_OTHER): Payer: Self-pay | Admitting: Pulmonary Disease

## 2023-12-20 VITALS — BP 122/77 | HR 61 | Ht 66.0 in | Wt 154.0 lb

## 2023-12-20 DIAGNOSIS — Z87891 Personal history of nicotine dependence: Secondary | ICD-10-CM

## 2023-12-20 DIAGNOSIS — R911 Solitary pulmonary nodule: Secondary | ICD-10-CM | POA: Diagnosis not present

## 2023-12-20 DIAGNOSIS — J4489 Other specified chronic obstructive pulmonary disease: Secondary | ICD-10-CM | POA: Diagnosis not present

## 2023-12-20 DIAGNOSIS — J439 Emphysema, unspecified: Secondary | ICD-10-CM

## 2023-12-20 DIAGNOSIS — K118 Other diseases of salivary glands: Secondary | ICD-10-CM | POA: Diagnosis not present

## 2023-12-20 NOTE — Progress Notes (Signed)
 Subjective:    Patient ID: Sara Powell, female    DOB: 1950-10-24, 73 y.o.   MRN: [id number]  HPI  73 year old female with lung nodules and emphysema who presents for evaluation of a lung nodule.  former smoker, approximately 55-pack-year history.   Seen by [facility] 2020 Presented with originally in February 2020 to [facility] with complaints of right-sided chest rib and upper abdominal pain. CT imaging  revealed multiple small cavitary lesions. There was innumerable small pulmonary nodules with central cavitation auto inflammatory lab work was negative incl ANCA negative, sed rate 25, MPO PR-3 antibodies negative, ANA negative, Sjogren's negative, HIV negative, QuantiFERON gold negative, eyelid biopsy in 2017 with granulomatous inflammation and plasma cells. Also found to have indeterminate liver lesions on CT imaging during previous admission. Follow-up MRI imaging revealed diagnosis of cavernous hemangioma within the liver.  Follow-up CT imaging  reveals improvement in these nodules.Nodules felt to be related to her infectious type symptoms that were present at the time and these inflammatory lesions are now resolving.  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  She presents now with a 7 mm right upper lobe nodule which was noted to be 6 mm in 07/2022.  She continues to smoke half a pack per day despite attempts to quit. She experiences severe cramp-like pain under her ribs, primarily at night, lasting about ten minutes. She uses Tylenol  for pain relief. Her emphysema is managed with albuterol and Symbicort inhalers as needed. Her social history includes a long history of smoking, currently at half a pack per day.  Significant tests/ events reviewed  PFTs June 2020 reveals a normal FEV1 FVC ratio, FEV1 of 2.13 L, 109% predicted, normal TLC 89%, DLCO 80%   HRCT 02/2019  Upper lung predominant scattered subcentimeter variably cavitary pulmonary  nodules, significantly decreased in size and number since 08/13/2018 chest CT. 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  LDCT chest 10/2023 Enlarging spiculated 7.1 mm anterior segment right upper lobe nodule. Lung-RADS 4A, suspicious  PET 11/2023 nodule measures 5 mm in diameter with maximum SUV 1.3.  0.4 by 0.8 cm lymph node or nodule along the posterior margin of the left parotid gland with maximum SUV 5.0.   Past Medical History:  Diagnosis Date   Anemia    Arthritis    Colitis    Depression    Diabetes mellitus without complication (HCC)    Pre-diabeti/NO MEDS   Emphysema of lung (HCC)    Fibroid    uterine   Fibromyalgia    Hypertension    Lactose intolerance    Menorrhagia    OSA (obstructive sleep apnea) 11/22/2022    Past Surgical History:  Procedure Laterality Date   ABDOMINAL HYSTERECTOMY     BREAST BIOPSY Left    CATARACT EXTRACTION, BILATERAL     COLONOSCOPY  01/19/2016   Dr.Nandigam   LAPAROSCOPIC SALPINGO OOPHERECTOMY     POLYPECTOMY     ROTATOR CUFF REPAIR     right shoulder   sty  on eyelid     left eye/removed 2 times   TUBAL LIGATION       Allergies  Allergen Reactions   Penicillins Hives and Itching    Did it involve swelling of the face/tongue/throat, SOB, or low BP? No Did it involve sudden or severe rash/hives, skin peeling, or any reaction on the inside of your mouth or nose? Yes Did you need to seek medical attention at  a hospital or doctor's office? No When did it last happen?   More than 10 years ago    If all above answers are "NO", may proceed with cephalosporin use.    Tylox [Oxycodone-Acetaminophen ] Hives and Itching    Social History   Socioeconomic History   Marital status: Married    Spouse name: Not on file   Number of children: Not on file   Years of education: Not on file   Highest education level: Not on file  Occupational History   Not on file  Tobacco Use   Smoking status: Every  Day    Current packs/day: 0.50    Average packs/day: 0.5 packs/day for 59.3 years (29.7 ttl pk-yrs)    Types: Cigarettes    Start date: 08/28/1964   Smokeless tobacco: Never  Vaping Use   Vaping status: Never Used  Substance and Sexual Activity   Alcohol use: Yes    Alcohol/week: 3.0 standard drinks of alcohol    Types: 3 Glasses of wine per week    Comment: wine   Drug use: No   Sexual activity: Not on file  Other Topics Concern   Not on file  Social History Narrative   Not on file   Social Drivers of Health   Financial Resource Strain: Not on file  Food Insecurity: Not on file  Transportation Needs: Not on file  Physical Activity: Not on file  Stress: Not on file  Social Connections: Not on file  Intimate Partner Violence: Not on file    Family History  Problem Relation Age of Onset   Diabetes Mother    Hypertension Mother    Hypertension Father    Colon cancer Sister 18   Colon polyps Sister    Diabetes Brother    Heart disease Maternal Aunt    Heart attack Maternal Aunt    Cancer Maternal Aunt        stomach   Diabetes Paternal Aunt    Diabetes Maternal Grandmother    Stroke Maternal Grandmother    Cancer Maternal Grandfather        colon   Colon cancer Maternal Grandfather    Diabetes Paternal Grandmother    Esophageal cancer Neg Hx    Rectal cancer Neg Hx    Stomach cancer Neg Hx       Review of Systems Constitutional: negative for anorexia, fevers and sweats  Eyes: negative for irritation, redness and visual disturbance  Ears, nose, mouth, throat, and face: negative for earaches, epistaxis, nasal congestion and sore throat  Respiratory: negative for cough, dyspnea on exertion, sputum and wheezing  Cardiovascular: negative for chest pain, dyspnea, lower extremity edema, orthopnea, palpitations and syncope  Gastrointestinal: negative for abdominal pain, constipation, diarrhea, melena, nausea and vomiting  Genitourinary:negative for dysuria,  frequency and hematuria  Hematologic/lymphatic: negative for bleeding, easy bruising and lymphadenopathy  Musculoskeletal:negative for arthralgias, muscle weakness and stiff joints  Neurological: negative for coordination problems, gait problems, headaches and weakness  Endocrine: negative for diabetic symptoms including polydipsia, polyuria and weight loss     Objective:   Physical Exam  Gen. Pleasant, well-nourished, in no distress, normal affect ENT - no pallor,icterus, no post nasal drip Neck: No JVD, no thyromegaly, no carotid bruits Lungs: no use of accessory muscles, no dullness to percussion, clear without rales or rhonchi  Cardiovascular: Rhythm regular, heart sounds  normal, no murmurs or gallops, no peripheral edema Abdomen: soft and non-tender, no hepatosplenomegaly, BS normal. Musculoskeletal: No deformities, no cyanosis  or clubbing Neuro:  alert, non focal       Assessment & Plan:    Emphysema Emphysema with worsening over the past five years,  due to smoking. More severe compared to previous scans. - Advise smoking cessation to prevent further progression. - Continue albuterol and Symbicort inhalers as needed. - Repeat spirometry within the next month to assess lung function.  Lung nodule A 7 mm lung nodule on the right side, increased from 6 mm the previous year. PET scan showed minimal activity (1/10), suggesting likely benign. Smoking history raises cancer risk, but slow growth and low PET activity are reassuring. - Schedule follow-up CT chest without contrast in six months to monitor the nodule. - Advise smoking cessation to reduce malignancy risk.  Parotid nodule - refer to ENT in [place]

## 2023-12-20 NOTE — Patient Instructions (Signed)
 X CT chest wo contrast in  December  X spirometry pre/post   X ENT consultation in [place]

## 2024-01-04 ENCOUNTER — Encounter (INDEPENDENT_AMBULATORY_CARE_PROVIDER_SITE_OTHER): Payer: Self-pay

## 2024-04-15 ENCOUNTER — Encounter: Payer: Self-pay | Admitting: Gastroenterology

## 2024-05-02 IMAGING — CT CT CHEST W/O CM
1 of 2 series · 15 of 32 positions shown, 19 images · non-contrast
Comparison: Chest CT 03/05/2019

CLINICAL DATA: Follow-up nodule, emphysema, cough



[Series 6: super d · axial · 0.78mm/px · z∈[-287,-25]mm · 15 of 368 slices shown, 19 images]
[im 20/368  mediastinal]
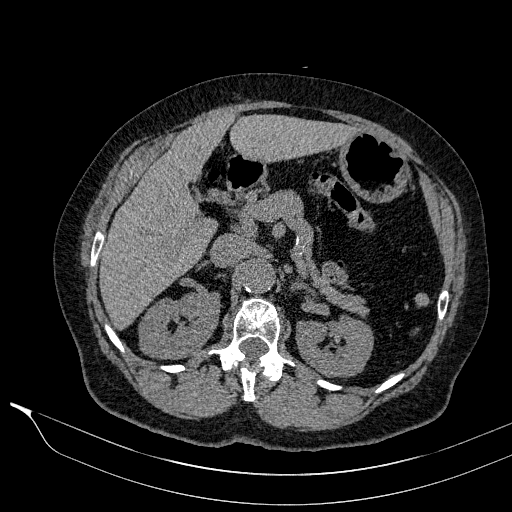
[im 20/368  lung]
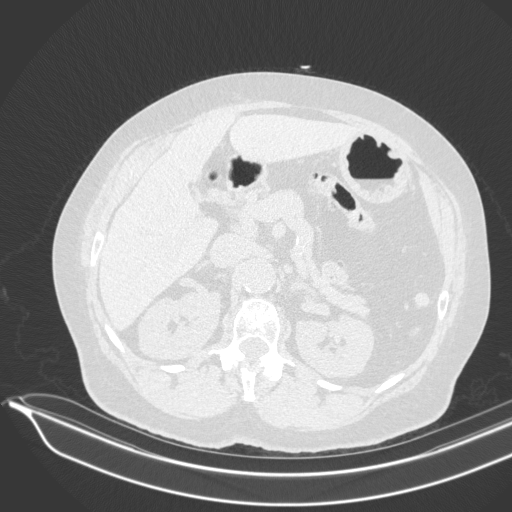
[im 58/368  lung]
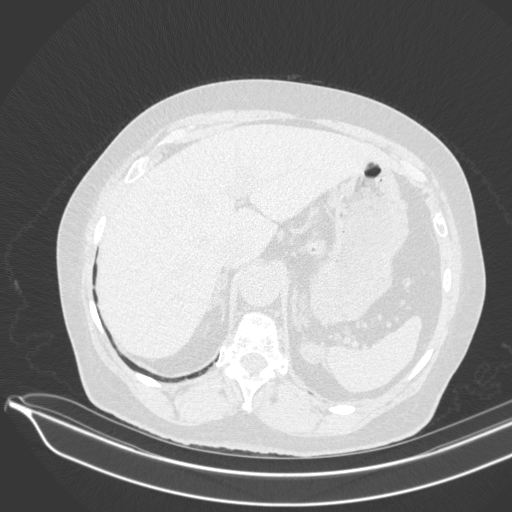
[im 78/368  lung]
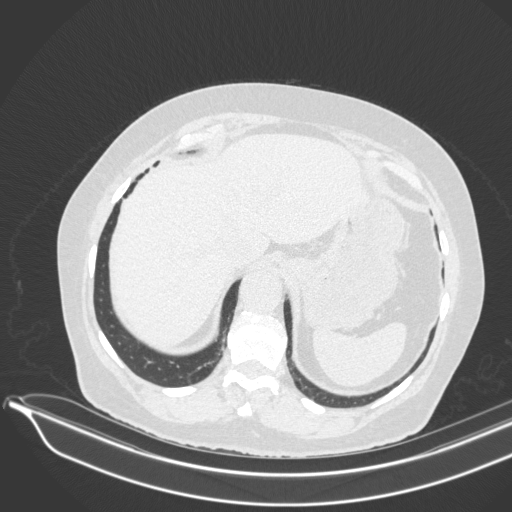
[im 97/368  lung]
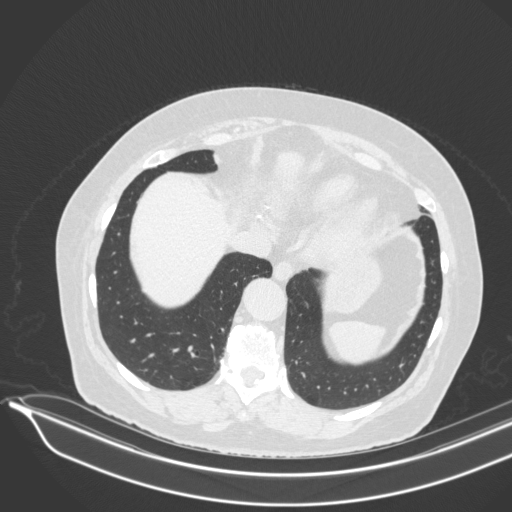
[im 123/368  mediastinal]
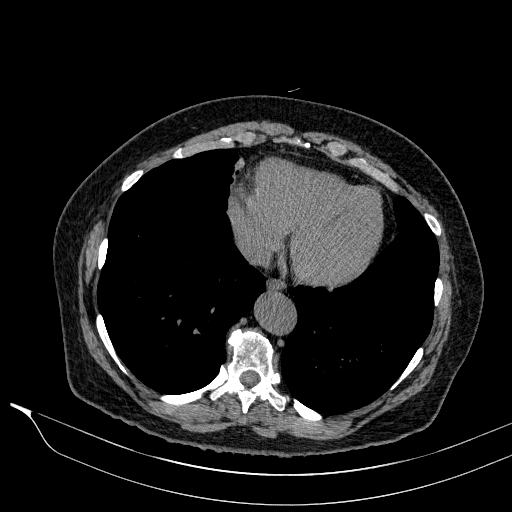
[im 123/368  lung]
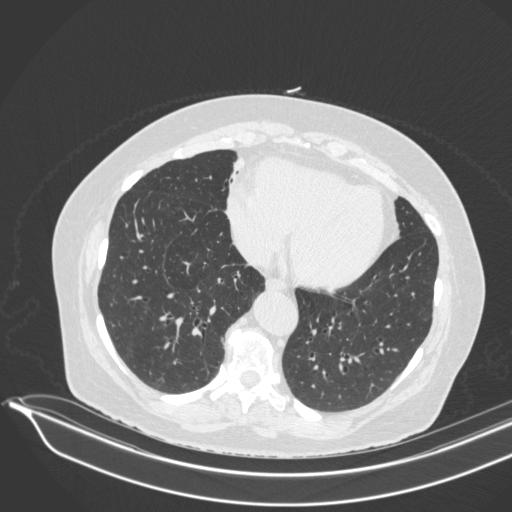
[im 136/368  lung]
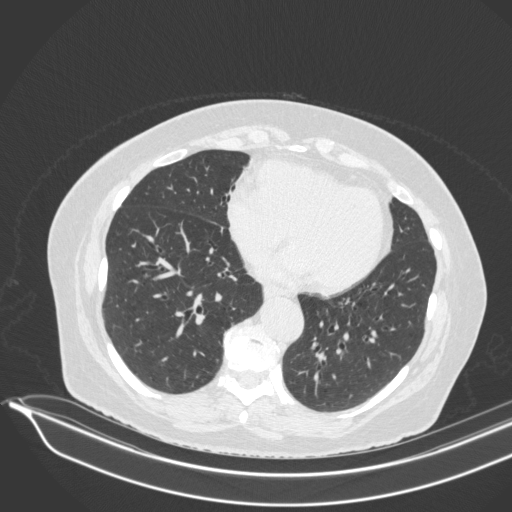
[im 172/368  lung]
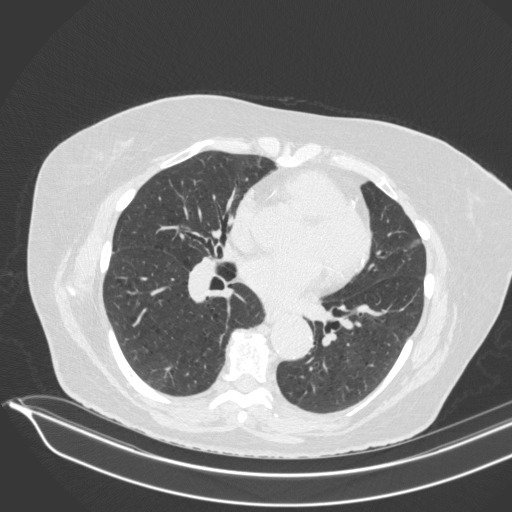
[im 174/368  lung]
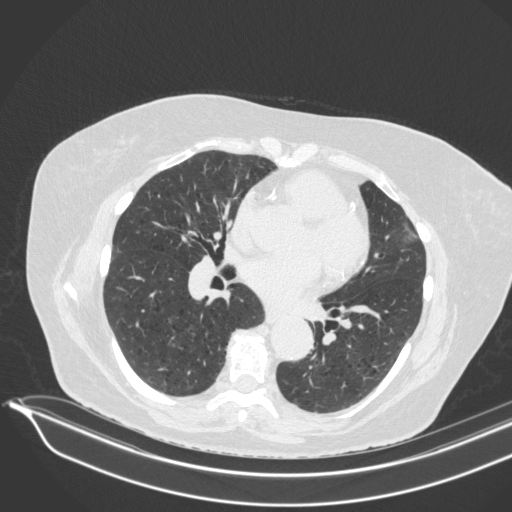
[im 194/368  mediastinal]
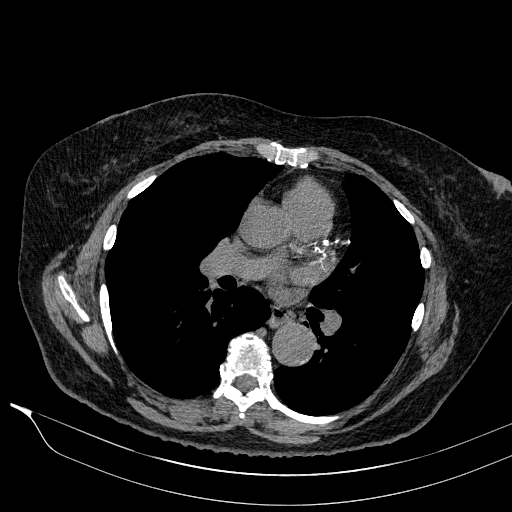
[im 194/368  lung]
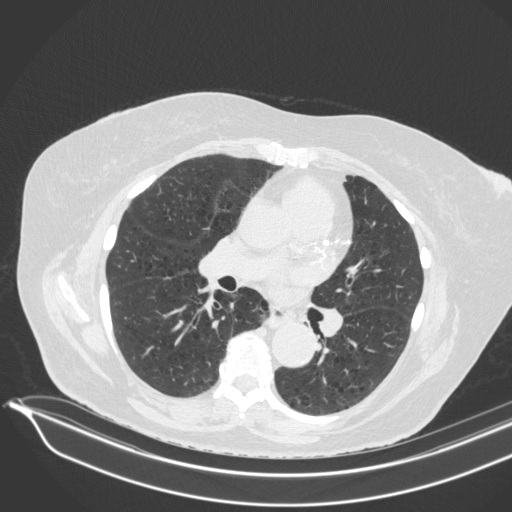
[im 232/368  lung]
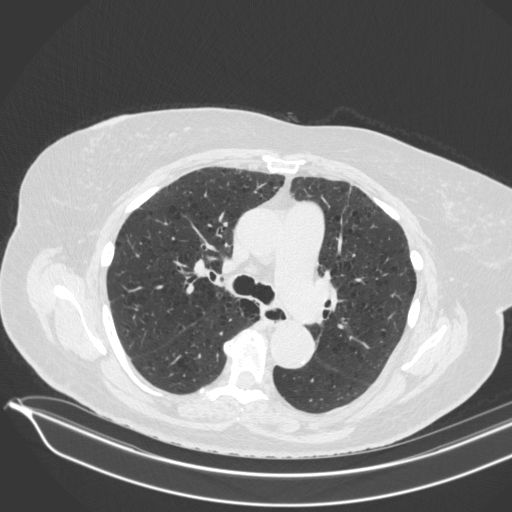
[im 245/368  lung]
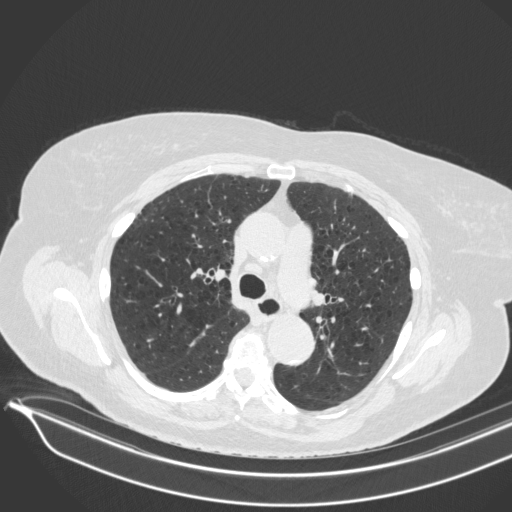
[im 271/368  lung]
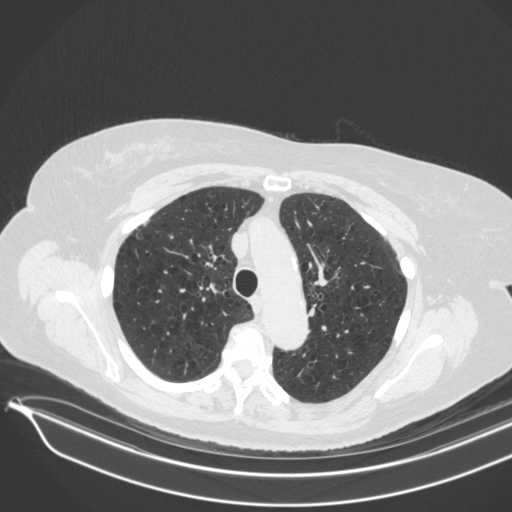
[im 290/368  mediastinal]
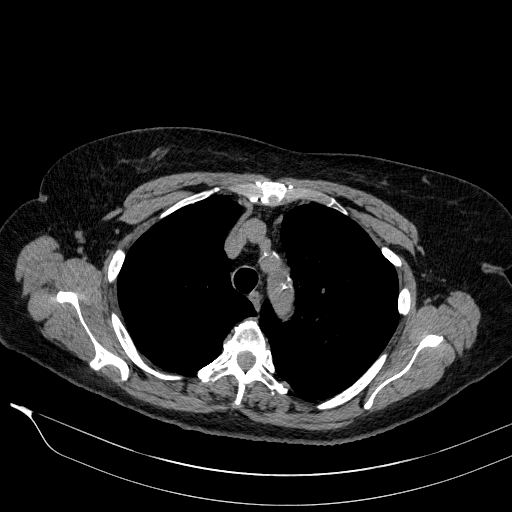
[im 290/368  lung]
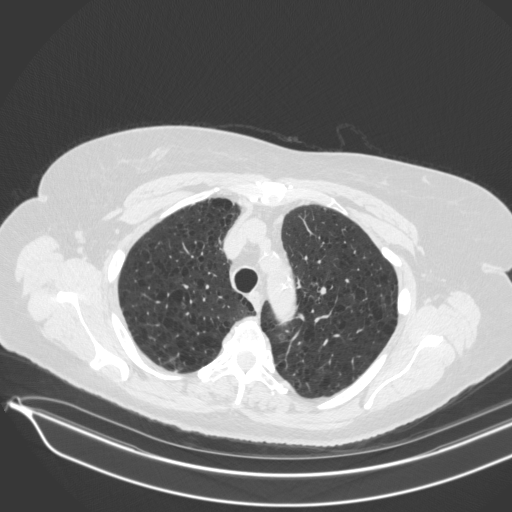
[im 310/368  lung]
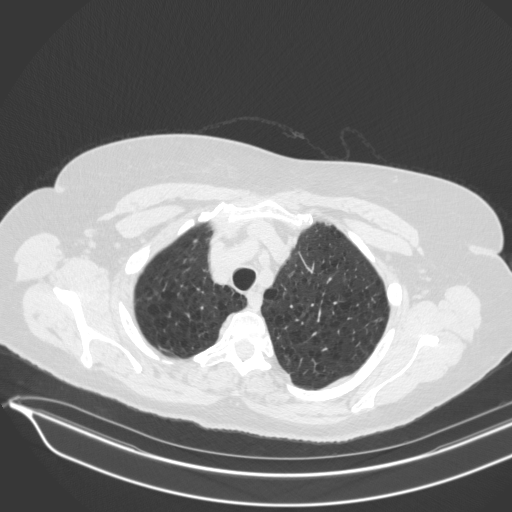
[im 348/368  lung]
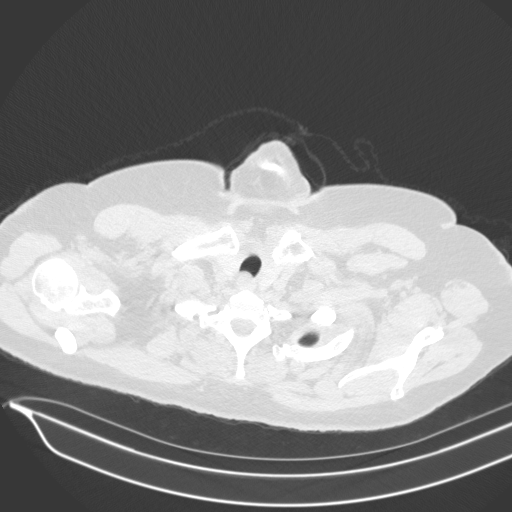

[15 of 32 positions shown; findings below may reference images not displayed]

FINDINGS: Cardiovascular: Normal cardiac size.No pericardial disease.Coronary
artery calcifications.Moderate atherosclerosis of the thoracic
aorta.

Mediastinum/Nodes: No lymphadenopathy.The thyroid is
unremarkable.Esophagus is unremarkable.The trachea is unremarkable.

Lungs/Pleura: Unchanged moderate centrilobular and paraseptal
emphysema.There is no focal airspace consolidation.There is no
pleural effusion. No pneumothorax.Resolved cavitary nodule in the
right upper lobe. No new suspicious pulmonary nodules or masses.
There is a prominent intrapulmonary lymph node along the right
oblique fissure. Unchanged right middle lobe subsegmental
atelectasis and bronchiolectasis medially.

Upper Abdomen: There are bilateral low-density adrenal nodules
measuring 1.0 cm on the right and 1.0 cm on the left, each measuring
density consistent with benign adenoma. There is a 1.1 cm left renal
stone, unchanged.

Musculoskeletal: No acute osseous abnormality.No suspicious lytic or
blastic lesions. Multilevel degenerative changes of the spine.
IMPRESSION: Resolved cavitary nodule in the right upper lobe. No new suspicious
pulmonary nodules.

Unchanged moderate centrilobular and paraseptal emphysema.

Bilateral 1.0 cm adrenal lesions compatible with benign adenomas.

Coronary artery atherosclerosis. Aortic Atherosclerosis
([id number]).

## 2024-05-13 ENCOUNTER — Ambulatory Visit: Admitting: *Deleted

## 2024-05-13 VITALS — Ht 66.0 in | Wt 155.0 lb

## 2024-05-13 DIAGNOSIS — Z860100 Personal history of colon polyps, unspecified: Secondary | ICD-10-CM

## 2024-05-13 DIAGNOSIS — Z800 Family history of malignant neoplasm of digestive organs: Secondary | ICD-10-CM

## 2024-05-13 MED ORDER — NA SULFATE-K SULFATE-MG SULF 17.5-3.13-1.6 GM/177ML PO SOLN: 1.0000 | Freq: Once | ORAL | 0 refills | Status: AC

## 2024-05-13 NOTE — Progress Notes (Signed)
 Pt's name and DOB verified at the beginning of the pre-visit with 2 identifiers   Pt denies any difficulty with ambulating,sitting, laying down or rolling side to side  Pt has no issues moving head neck or swallowing  No egg or soy allergy known to patient   No issues known to pt with past sedation  No FH of Malignant Hyperthermia  Pt is not on home 02   Pt is not on blood thinners   Pt denies issues with constipation   Pt is not on dialysis  Pt denise any abnormal heart rhythms   Pt denies any upcoming cardiac testing  Patient's chart reviewed by Norleen Schillings CNRA prior to pre-visit and patient appropriate for the [facility].  Pre-visit completed and red dot placed by patient's name on their procedure day (on provider's schedule).    Visit in person  Pt states weight is  Pt given  both [facility] main # and MD on call # prior to instructions.  Informed pt to come in at the time discussed and is shown on PV instructions.  Pt instructed to use Singlecare.com or GoodRx for a price reduction on prep  Instructed pt where to find PV instructions in My Chart. Copy of instructions given to pt   Instructed pt on all aspects of written instructions including med holds clothing to wear and foods to eat and not eat as well as after procedure legal restrictions and to call MD on call if needed.. Pt states understanding. Instructed pt to review instructions again prior to procedure and call main # given if has any questions or any issues. Pt states they will.

## 2024-05-21 ENCOUNTER — Encounter: Payer: Self-pay | Admitting: Gastroenterology

## 2024-05-28 ENCOUNTER — Ambulatory Visit: Admitting: Gastroenterology

## 2024-05-28 ENCOUNTER — Encounter: Payer: Self-pay | Admitting: Gastroenterology

## 2024-05-28 VITALS — BP 158/95 | HR 66 | Temp 97.7°F | Resp 12 | Ht 66.0 in | Wt 155.0 lb

## 2024-05-28 DIAGNOSIS — Z1211 Encounter for screening for malignant neoplasm of colon: Secondary | ICD-10-CM | POA: Diagnosis not present

## 2024-05-28 DIAGNOSIS — K648 Other hemorrhoids: Secondary | ICD-10-CM

## 2024-05-28 DIAGNOSIS — K5730 Diverticulosis of large intestine without perforation or abscess without bleeding: Secondary | ICD-10-CM

## 2024-05-28 DIAGNOSIS — Z860100 Personal history of colon polyps, unspecified: Secondary | ICD-10-CM

## 2024-05-28 DIAGNOSIS — Z800 Family history of malignant neoplasm of digestive organs: Secondary | ICD-10-CM

## 2024-05-28 DIAGNOSIS — D125 Benign neoplasm of sigmoid colon: Secondary | ICD-10-CM | POA: Diagnosis not present

## 2024-05-28 DIAGNOSIS — K635 Polyp of colon: Secondary | ICD-10-CM

## 2024-05-28 DIAGNOSIS — Z860101 Personal history of adenomatous and serrated colon polyps: Secondary | ICD-10-CM

## 2024-05-28 DIAGNOSIS — D120 Benign neoplasm of cecum: Secondary | ICD-10-CM

## 2024-05-28 MED ORDER — SODIUM CHLORIDE 0.9 % IV SOLN: 500.0000 mL | Freq: Once | INTRAVENOUS | Status: DC

## 2024-05-28 NOTE — Progress Notes (Signed)
 Called to room to assist during endoscopic procedure.  Patient ID and intended procedure confirmed with present staff. Received instructions for my participation in the procedure from the performing physician.

## 2024-05-28 NOTE — Patient Instructions (Signed)
-  Handout on polyp and diverticulosis provided. -await pathology results. -repeat colonoscopy for surveillance not recommended due to age.  -Continue present medications.   YOU HAD AN ENDOSCOPIC PROCEDURE TODAY AT THE [facility]:   Refer to the procedure report that was given to you for any specific questions about what was found during the examination.  If the procedure report does not answer your questions, please call your gastroenterologist to clarify.  If you requested that your care partner not be given the details of your procedure findings, then the procedure report has been included in a sealed envelope for you to review at your convenience later.  YOU SHOULD EXPECT: Some feelings of bloating in the abdomen. Passage of more gas than usual.  Walking can help get rid of the air that was put into your GI tract during the procedure and reduce the bloating. If you had a lower endoscopy (such as a colonoscopy or flexible sigmoidoscopy) you may notice spotting of blood in your stool or on the toilet paper. If you underwent a bowel prep for your procedure, you may not have a normal bowel movement for a few days.  Please Note:  You might notice some irritation and congestion in your nose or some drainage.  This is from the oxygen used during your procedure.  There is no need for concern and it should clear up in a day or so.  SYMPTOMS TO REPORT IMMEDIATELY:  Following lower endoscopy (colonoscopy or flexible sigmoidoscopy):  Excessive amounts of blood in the stool  Significant tenderness or worsening of abdominal pains  Swelling of the abdomen that is new, acute  Fever of 100F or higher    For urgent or emergent issues, a gastroenterologist can be reached at any hour by calling [phone]. Do not use MyChart messaging for urgent concerns.    DIET:  We do recommend a small meal at first, but then you may proceed to your regular diet.  Drink plenty of fluids but you should  avoid alcoholic beverages for 24 hours.  ACTIVITY:  You should plan to take it easy for the rest of today and you should NOT DRIVE or use heavy machinery until tomorrow (because of the sedation medicines used during the test).    FOLLOW UP: Our staff will call the number listed on your records the next business day following your procedure.  We will call around 7:15- 8:00 am to check on you and address any questions or concerns that you may have regarding the information given to you following your procedure. If we do not reach you, we will leave a message.     If any biopsies were taken you will be contacted by phone or by letter within the next 1-3 weeks.  Please call us  at [phone] if you have not heard about the biopsies in 3 weeks.    SIGNATURES/CONFIDENTIALITY: You and/or your care partner have signed paperwork which will be entered into your electronic medical record.  These signatures attest to the fact that that the information above on your After Visit Summary has been reviewed and is understood.  Full responsibility of the confidentiality of this discharge information lies with you and/or your care-partner.

## 2024-05-28 NOTE — Progress Notes (Signed)
 Report given to PACU, vss

## 2024-05-28 NOTE — Op Note (Signed)
 [facility] Patient Name: Sara Powell Procedure Date: 05/28/2024 11:55 AM MRN: [id number] Endoscopist: Gustav ALONSO Mcgee , MD, [id number] Age: 73 Referring MD:  Date of Birth: 16-Oct-1950 Gender: Female Account #: [id number] Procedure:                Colonoscopy Indications:              Screening in patient at increased risk: Family                            history of 1st-degree relative with colorectal                            cancer, High risk colon cancer surveillance:                            Personal history of colonic polyps, High risk colon                            cancer surveillance: Personal history of multiple                            (3 or more) adenomas Medicines:                Monitored Anesthesia Care Procedure:                Pre-Anesthesia Assessment:                           - Prior to the procedure, a History and Physical                            was performed, and patient medications and                            allergies were reviewed. The patient's tolerance of                            previous anesthesia was also reviewed. The risks                            and benefits of the procedure and the sedation                            options and risks were discussed with the patient.                            All questions were answered, and informed consent                            was obtained. Prior Anticoagulants: The patient has                            taken no anticoagulant or antiplatelet agents. ASA  Grade Assessment: III - A patient with severe                            systemic disease. After reviewing the risks and                            benefits, the patient was deemed in satisfactory                            condition to undergo the procedure.                           After obtaining informed consent, the colonoscope                            was passed under direct vision.  Throughout the                            procedure, the patient's blood pressure, pulse, and                            oxygen saturations were monitored continuously. The                            Olympus Scope [id number] was introduced through the                            anus and advanced to the the cecum, identified by                            appendiceal orifice and ileocecal valve. The                            colonoscopy was performed without difficulty. The                            patient tolerated the procedure well. The quality                            of the bowel preparation was good. The ileocecal                            valve, appendiceal orifice, and rectum were                            photographed. Scope In: 12:09:46 PM Scope Out: 12:24:03 PM Scope Withdrawal Time: 0 hours 10 minutes 28 seconds  Total Procedure Duration: 0 hours 14 minutes 17 seconds  Findings:                 The perianal and digital rectal examinations were                            normal.  A 1 mm polyp was found in the cecum. The polyp was                            sessile. The polyp was removed with a cold biopsy                            forceps. Resection and retrieval were complete.                           Two sessile polyps were found in the sigmoid colon.                            The polyps were 4 to 6 mm in size. These polyps                            were removed with a cold snare. Resection and                            retrieval were complete.                           Scattered small-mouthed diverticula were found in                            the sigmoid colon and descending colon.                           Non-bleeding external and internal hemorrhoids were                            found during retroflexion. The hemorrhoids were                            medium-sized. Complications:            No immediate complications. Estimated  Blood Loss:     Estimated blood loss was minimal. Impression:               - One 1 mm polyp in the cecum, removed with a cold                            biopsy forceps. Resected and retrieved.                           - Two 4 to 6 mm polyps in the sigmoid colon,                            removed with a cold snare. Resected and retrieved.                           - Diverticulosis in the sigmoid colon and in the                            descending colon.                           -  Non-bleeding external and internal hemorrhoids. Recommendation:           - Resume previous diet.                           - Continue present medications.                           - Await pathology results.                           - No repeat colonoscopy due to age. Romone Shaff V. Syrah Daughtrey, MD 05/28/2024 12:30:35 PM This report has been signed electronically.

## 2024-05-28 NOTE — Progress Notes (Signed)
 Pt's states no medical or surgical changes since previsit or office visit.

## 2024-05-28 NOTE — Progress Notes (Signed)
 [facility] History and Physical   Primary Care Physician:  Haze Kingfisher, MD   Reason for Procedure:  History of adenomatous colon polyps, family h/o colon cancer  Plan:    Surveillance colonoscopy with possible interventions as needed     HPI: Sara Powell is a very pleasant 73 y.o. female here for surveillance colonoscopy. Denies any nausea, vomiting, abdominal pain, melena or bright red blood per rectum  The risks and benefits as well as alternatives of endoscopic procedure(s) have been discussed and reviewed.  The patient was provided an opportunity to ask questions and all were answered. The patient agreed with the plan and demonstrated an understanding of the instructions.   Past Medical History:  Diagnosis Date   Anemia    Arthritis    Cataract    Colitis    Depression    Diabetes mellitus without complication (HCC)    Pre-diabeti/NO MEDS   Emphysema of lung (HCC)    Fibroid    uterine   Fibromyalgia    Hyperlipidemia    Hypertension    Lactose intolerance    Menorrhagia    OSA (obstructive sleep apnea) 11/22/2022   Sleep apnea     Past Surgical History:  Procedure Laterality Date   ABDOMINAL HYSTERECTOMY     BREAST BIOPSY Left    CATARACT EXTRACTION, BILATERAL     COLONOSCOPY  01/19/2016   Dr.Sherial Ebrahim   LAPAROSCOPIC SALPINGO OOPHERECTOMY     POLYPECTOMY     ROTATOR CUFF REPAIR     right shoulder   sty  on eyelid     left eye/removed 2 times   TUBAL LIGATION      Prior to Admission medications   Medication Sig Start Date End Date Taking? Authorizing Provider  aspirin  81 MG tablet Take 81 mg by mouth daily.   Yes [provider]  budesonide-formoterol (SYMBICORT) 160-4.5 MCG/ACT inhaler Inhale 2 puffs into the lungs 2 (two) times daily.   Yes [provider]  fluticasone (FLONASE) 50 MCG/ACT nasal spray as needed.   Yes [provider]  gabapentin (NEURONTIN) 100 MG capsule Take 100 mg by mouth 3  (three) times daily. Patient taking differently: Take 100 mg by mouth at bedtime. 02/03/21  Yes [provider]  hydrochlorothiazide  (HYDRODIURIL ) 25 MG tablet Take 25 mg by mouth daily.   Yes [provider]  latanoprost  (XALATAN ) 0.005 % ophthalmic solution Place 1 drop into both eyes at bedtime.    Yes [provider]  lisinopril  (PRINIVIL ,ZESTRIL ) 20 MG tablet Take 20 mg by mouth daily.   Yes [provider]  Multiple Vitamin (MULTIVITAMIN) tablet Take 1 tablet by mouth daily.   Yes [provider]  omeprazole (PRILOSEC) 40 MG capsule Take by mouth daily.   Yes [provider]  OVER THE COUNTER MEDICATION daily at 12 noon. Prevagen for memory   Yes [provider]  pravastatin  (PRAVACHOL ) 40 MG tablet Take 40 mg by mouth daily. 06/21/18  Yes [provider]  tiZANidine  (ZANAFLEX ) 4 MG tablet Take 4 mg by mouth 2 (two) times daily as needed. 10/27/20  Yes [provider]  traMADol  (ULTRAM ) 50 MG tablet Take 50 mg by mouth every 6 (six) hours as needed.   Yes [provider]  traZODone  (DESYREL ) 50 MG tablet Take 50 mg by mouth at bedtime.    Yes [provider]  VEVYE 0.1 % SOLN INSTILL ONE DROP IN EACH EYE TWICE DAILY   Yes [provider]  albuterol (VENTOLIN HFA) 108 (90 Base) MCG/ACT inhaler Inhale into the lungs every 6 (six) hours as needed for wheezing or shortness of breath.    [provider]  APPLE CIDER VINEGAR PO Take by mouth. Chewable Patient not taking: Reported on 12/20/2023    [provider]  Polyethylene Glycol 400 (BLINK TEARS) 0.25 % SOLN Apply 2 drops to eye as needed.    [provider]    Current Outpatient Medications  Medication Sig Dispense Refill   aspirin  81 MG tablet Take 81 mg by mouth daily.     budesonide-formoterol (SYMBICORT) 160-4.5 MCG/ACT inhaler Inhale 2 puffs into the lungs 2 (two) times daily.     fluticasone (FLONASE) 50  MCG/ACT nasal spray as needed.     gabapentin (NEURONTIN) 100 MG capsule Take 100 mg by mouth 3 (three) times daily. (Patient taking differently: Take 100 mg by mouth at bedtime.)     hydrochlorothiazide  (HYDRODIURIL ) 25 MG tablet Take 25 mg by mouth daily.     latanoprost  (XALATAN ) 0.005 % ophthalmic solution Place 1 drop into both eyes at bedtime.      lisinopril  (PRINIVIL ,ZESTRIL ) 20 MG tablet Take 20 mg by mouth daily.     Multiple Vitamin (MULTIVITAMIN) tablet Take 1 tablet by mouth daily.     omeprazole (PRILOSEC) 40 MG capsule Take by mouth daily.     OVER THE COUNTER MEDICATION daily at 12 noon. Prevagen for memory     pravastatin  (PRAVACHOL ) 40 MG tablet Take 40 mg by mouth daily.     tiZANidine  (ZANAFLEX ) 4 MG tablet Take 4 mg by mouth 2 (two) times daily as needed.     traMADol  (ULTRAM ) 50 MG tablet Take 50 mg by mouth every 6 (six) hours as needed.     traZODone  (DESYREL ) 50 MG tablet Take 50 mg by mouth at bedtime.      VEVYE 0.1 % SOLN INSTILL ONE DROP IN EACH EYE TWICE DAILY     albuterol (VENTOLIN HFA) 108 (90 Base) MCG/ACT inhaler Inhale into the lungs every 6 (six) hours as needed for wheezing or shortness of breath.     APPLE CIDER VINEGAR PO Take by mouth. Chewable (Patient not taking: Reported on 12/20/2023)     Polyethylene Glycol 400 (BLINK TEARS) 0.25 % SOLN Apply 2 drops to eye as needed.     Current Facility-Administered Medications  Medication Dose Route Frequency Provider Last Rate Last Admin   0.9 %  sodium chloride  infusion  500 mL Intravenous Once Khristi Schiller V, MD        Allergies as of 05/28/2024 - Review Complete 05/28/2024  Allergen Reaction Noted   Penicillins Hives and Itching 08/12/2012   Tylox [oxycodone-acetaminophen ] Hives and Itching 08/12/2012    Family History  Problem Relation Age of Onset   Diabetes Mother    Hypertension Mother    Hypertension Father    Colon cancer Sister 74   Colon polyps Sister    Diabetes Brother    Heart  disease Maternal Aunt    Heart attack Maternal Aunt    Cancer Maternal Aunt        stomach   Diabetes Paternal Aunt    Diabetes Maternal Grandmother    Stroke Maternal Grandmother    Cancer Maternal Grandfather        colon   Colon cancer Maternal Grandfather    Diabetes Paternal Grandmother    Esophageal cancer Neg Hx    Rectal cancer Neg Hx    Stomach  cancer Neg Hx     Social History   Socioeconomic History   Marital status: Married    Spouse name: Not on file   Number of children: Not on file   Years of education: Not on file   Highest education level: Not on file  Occupational History   Not on file  Tobacco Use   Smoking status: Every Day    Current packs/day: 0.50    Average packs/day: 0.5 packs/day for 59.7 years (29.9 ttl pk-yrs)    Types: Cigarettes    Start date: 08/28/1964   Smokeless tobacco: Never  Vaping Use   Vaping status: Never Used  Substance and Sexual Activity   Alcohol use: Yes    Alcohol/week: 3.0 standard drinks of alcohol    Types: 3 Glasses of wine per week    Comment: wine   Drug use: No   Sexual activity: Not on file  Other Topics Concern   Not on file  Social History Narrative   Not on file   Social Drivers of Health   Financial Resource Strain: Not on file  Food Insecurity: Not on file  Transportation Needs: Not on file  Physical Activity: Not on file  Stress: Not on file  Social Connections: Not on file  Intimate Partner Violence: Not on file    Review of Systems:  All other review of systems negative except as mentioned in the HPI.  Physical Exam: Vital signs in last 24 hours: BP (!) 146/78   Pulse 65   Temp 97.7 F (36.5 C) (Temporal)   Ht 5' 6 (1.676 m)   Wt 155 lb (70.3 kg)   SpO2 96%   BMI 25.02 kg/m  General:   Alert, NAD Lungs:  Clear .   Heart:  Regular rate and rhythm Abdomen:  Soft, nontender and nondistended. Neuro/Psych:  Alert and cooperative. Normal mood and affect. A and O x 3  Reviewed labs,  radiology imaging, old records and pertinent past GI work up  Patient is appropriate for planned procedure(s) and anesthesia in an ambulatory setting   K. Veena Dray Dente , MD [phone]

## 2024-05-29 ENCOUNTER — Telehealth: Payer: Self-pay | Admitting: *Deleted

## 2024-05-29 ENCOUNTER — Ambulatory Visit (HOSPITAL_BASED_OUTPATIENT_CLINIC_OR_DEPARTMENT_OTHER)
Admission: RE | Admit: 2024-05-29 | Discharge: 2024-05-29 | Disposition: A | Discharge status: Home / Self Care | Source: Ambulatory Visit | Attending: Pulmonary Disease | Admitting: Pulmonary Disease

## 2024-05-29 DIAGNOSIS — R911 Solitary pulmonary nodule: Secondary | ICD-10-CM | POA: Insufficient documentation

## 2024-05-29 NOTE — Telephone Encounter (Signed)
°  Follow up Call-     05/28/2024   11:08 AM  Call back number  Post procedure Call Back phone  # [phone]  Permission to leave phone message Yes     Patient questions:  Do you have a fever, pain , or abdominal swelling? No. Pain Score  0 *  Have you tolerated food without any problems? Yes.    Have you been able to return to your normal activities? Yes.    Do you have any questions about your discharge instructions: Diet   No. Medications  No. Follow up visit  No.  Do you have questions or concerns about your Care? No.  Actions: * If pain score is 4 or above: No action needed, pain <4.

## 2024-05-30 LAB — SURGICAL PATHOLOGY

## 2024-06-16 ENCOUNTER — Ambulatory Visit: Payer: Self-pay | Admitting: Pulmonary Disease

## 2024-06-16 DIAGNOSIS — R911 Solitary pulmonary nodule: Secondary | ICD-10-CM

## 2024-06-16 NOTE — Telephone Encounter (Signed)
 Nodule minimally enlarged from 6 mm to 7 to 8 mm. 29-month follow-up scan recommended which has been ordered for mid March We can reschedule her follow-up visit to after CT scan

## 2024-06-17 NOTE — Progress Notes (Signed)
 Left pt message to return call on VM

## 2024-06-18 NOTE — Progress Notes (Signed)
 Pt notified of results will need to cancel appt in Feb and make one for March

## 2024-07-17 ENCOUNTER — Ambulatory Visit: Payer: Self-pay | Admitting: Gastroenterology

## 2024-07-25 ENCOUNTER — Other Ambulatory Visit: Payer: Self-pay | Admitting: Family Medicine

## 2024-07-25 DIAGNOSIS — Z1231 Encounter for screening mammogram for malignant neoplasm of breast: Secondary | ICD-10-CM

## 2024-08-01 ENCOUNTER — Ambulatory Visit (HOSPITAL_BASED_OUTPATIENT_CLINIC_OR_DEPARTMENT_OTHER): Admitting: Pulmonary Disease

## 2024-08-26 ENCOUNTER — Ambulatory Visit

## 2024-09-05 ENCOUNTER — Other Ambulatory Visit (HOSPITAL_BASED_OUTPATIENT_CLINIC_OR_DEPARTMENT_OTHER)

## 2024-09-22 ENCOUNTER — Ambulatory Visit (HOSPITAL_BASED_OUTPATIENT_CLINIC_OR_DEPARTMENT_OTHER): Admitting: Pulmonary Disease
